# Patient Record
Sex: Male | Born: 1950 | Race: Black or African American | Hispanic: No | Marital: Single | Smoking: Never smoker
Health system: Southern US, Community
[De-identification: ages and names within clinical notes are randomized; demographics above are authoritative.]

## PROBLEM LIST (undated history)

## (undated) DIAGNOSIS — D179 Benign lipomatous neoplasm, unspecified: Secondary | ICD-10-CM

---

## 2006-03-16 ENCOUNTER — Ambulatory Visit (HOSPITAL_COMMUNITY): Admission: RE | Admit: 2006-03-16 | Discharge: 2006-03-16 | Payer: Self-pay | Admitting: Gastroenterology

## 2006-03-16 ENCOUNTER — Encounter (INDEPENDENT_AMBULATORY_CARE_PROVIDER_SITE_OTHER): Payer: Self-pay | Admitting: *Deleted

## 2009-04-16 ENCOUNTER — Encounter: Admission: RE | Admit: 2009-04-16 | Discharge: 2009-04-16 | Payer: Self-pay | Admitting: Occupational Medicine

## 2010-03-05 IMAGING — CR DG NASAL BONES 3+V
2 series · 2 of 2 positions shown · non-contrast
Comparison: None

CLINICAL DATA: Injured playing basketball

NASAL BONES - 3+ VIEW

[view not recorded (1 of 2)]
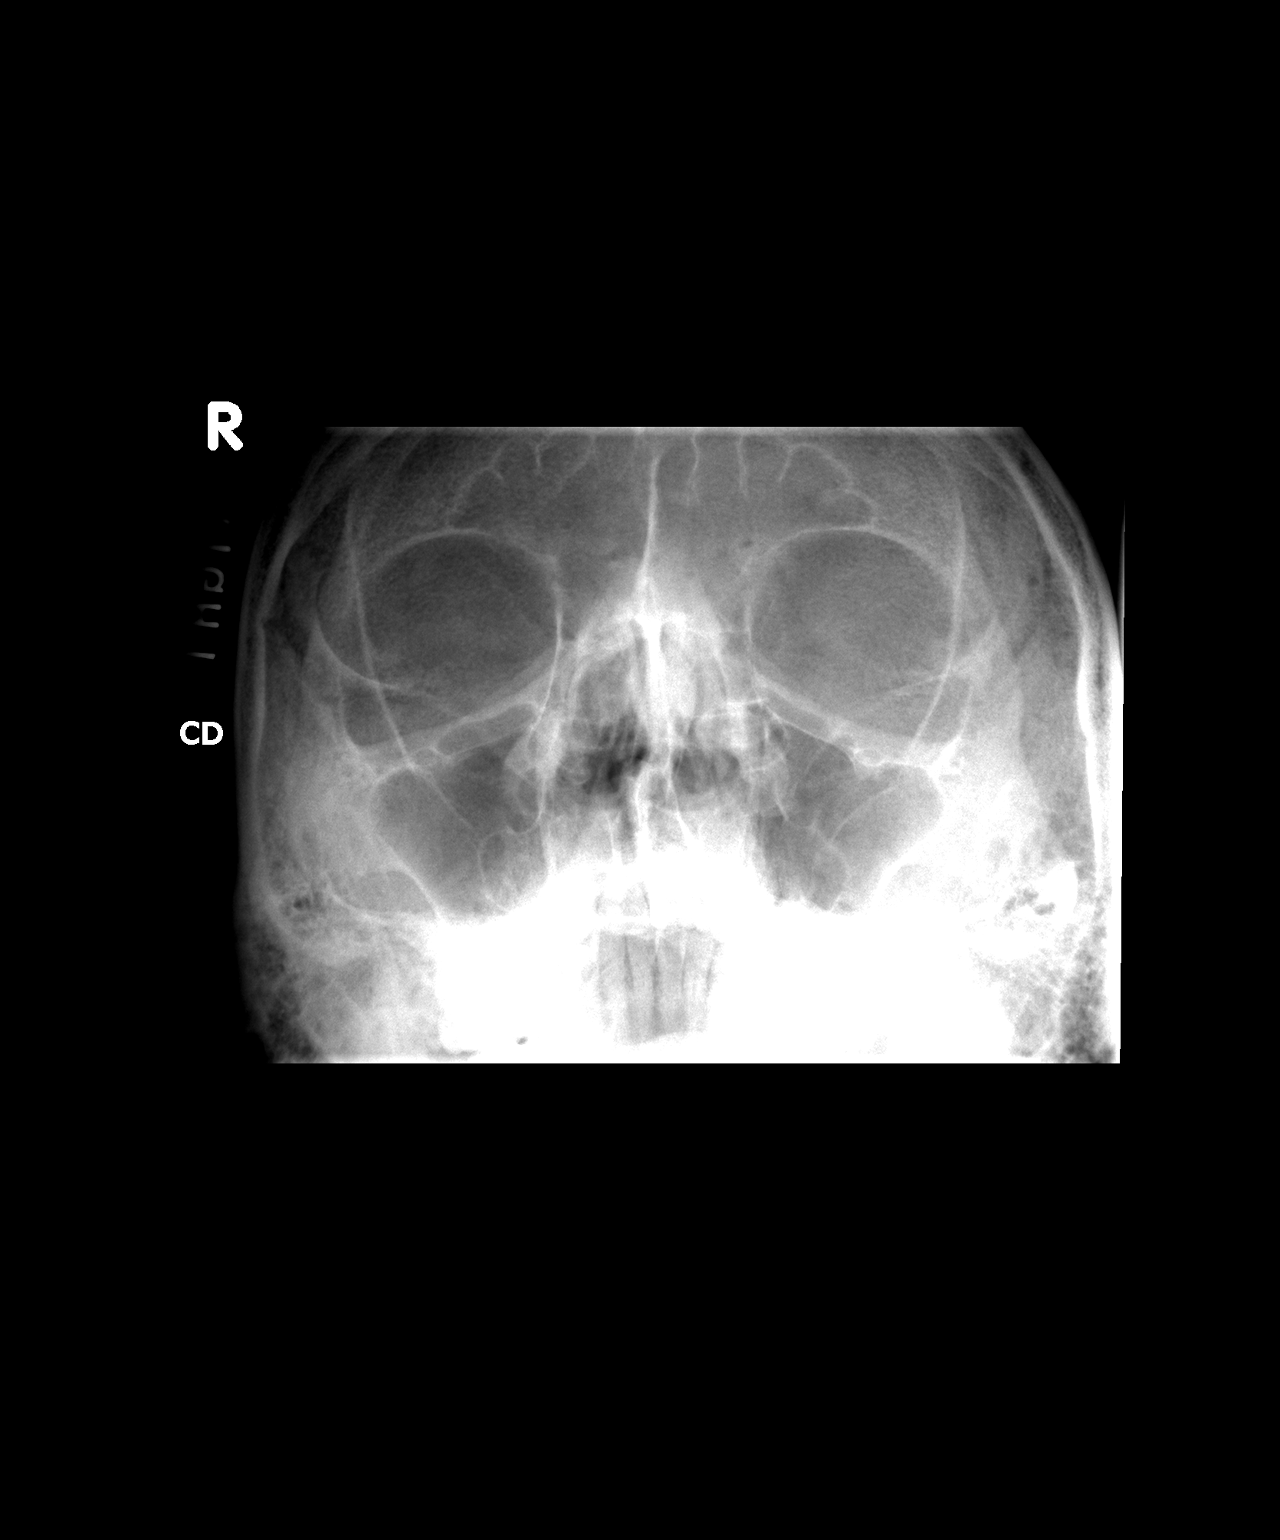

[view not recorded (2 of 2)]
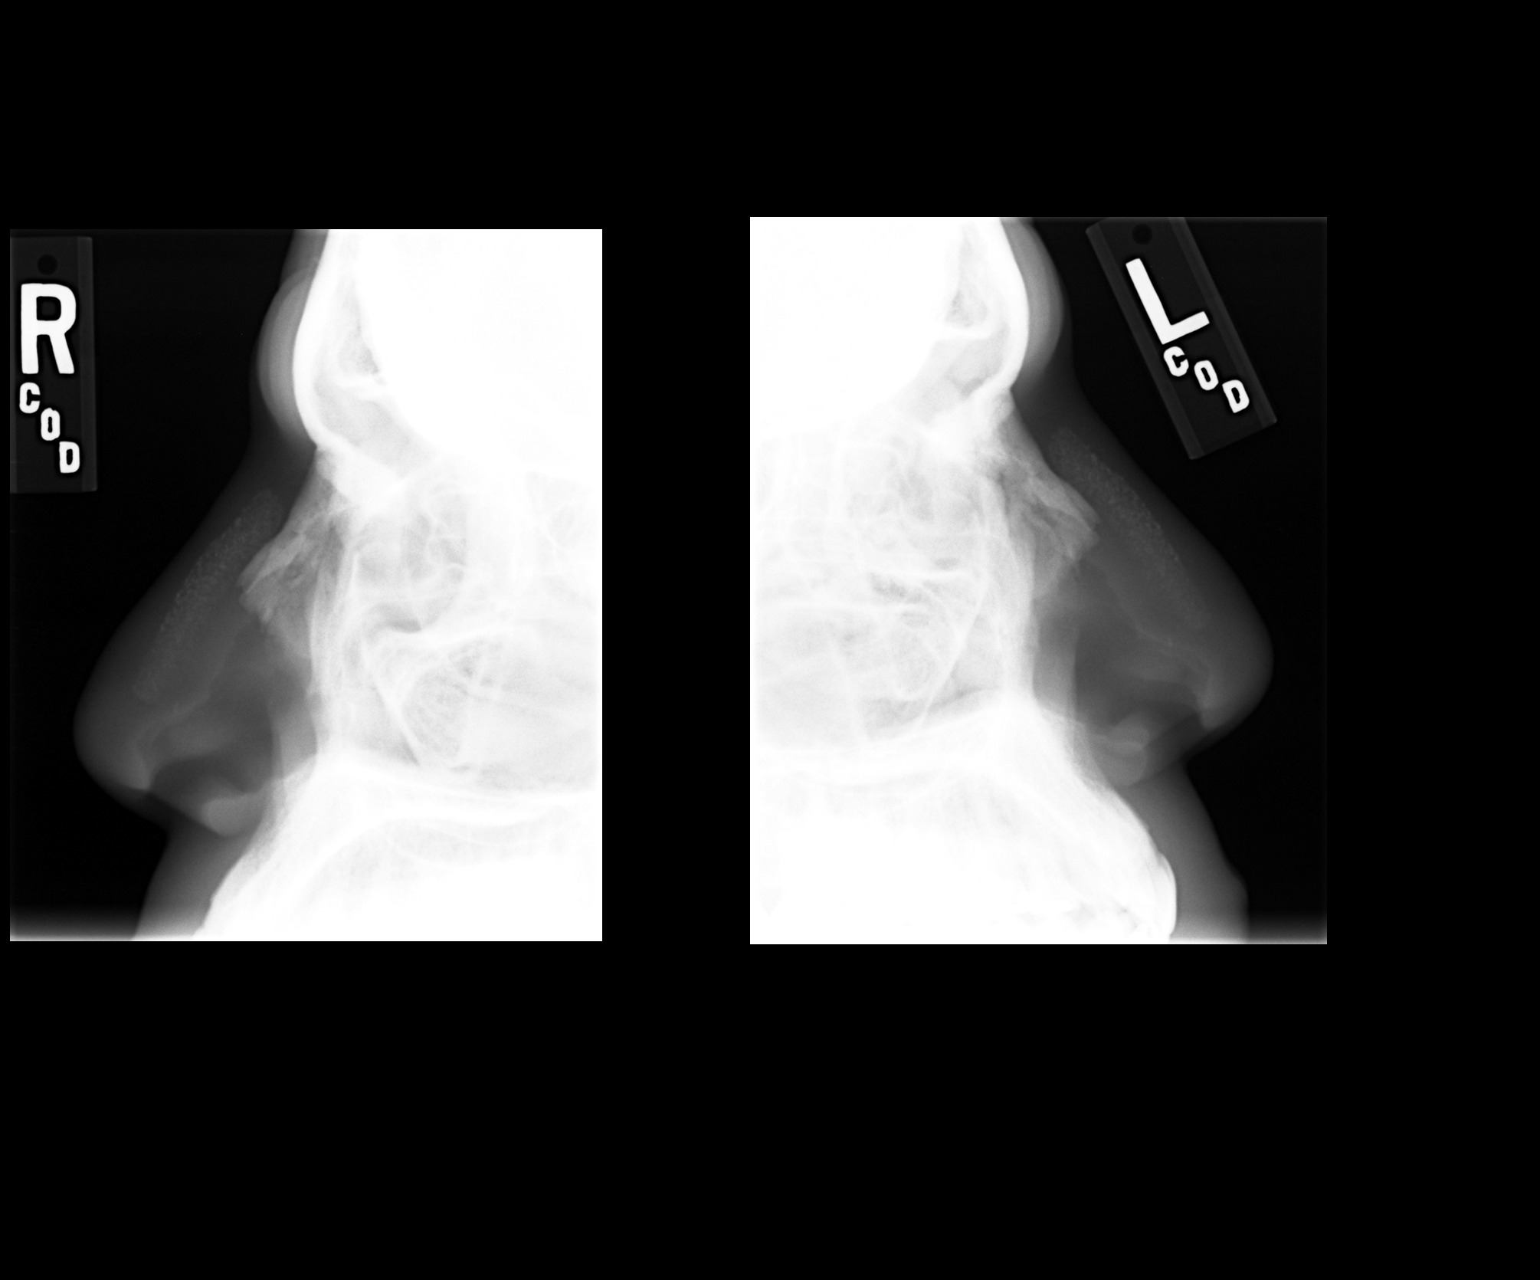

[2 of 2 positions shown; findings below may reference images not displayed]

FINDINGS: No acute fracture is seen.  There is however a linear
soft tissue opacity noted just superficial to the nasal bone
extending distally.  I believe this probably relates to the prior
nasal surgery for prior fracture.  The nasal spine is intact and
the paranasal sinuses are clear.
IMPRESSION: No acute nasal bone fracture.

## 2011-10-10 HISTORY — PX: NASAL FRACTURE SURGERY: SHX718

## 2014-02-18 ENCOUNTER — Encounter (HOSPITAL_BASED_OUTPATIENT_CLINIC_OR_DEPARTMENT_OTHER): Payer: Self-pay | Admitting: *Deleted

## 2014-02-20 ENCOUNTER — Encounter (HOSPITAL_BASED_OUTPATIENT_CLINIC_OR_DEPARTMENT_OTHER): Payer: Self-pay | Admitting: *Deleted

## 2014-02-20 NOTE — Progress Notes (Signed)
NPO AFTER MN WITH EXCEPTION CLEAR LIQUIDS UNTIL 0830 (NO CREAM/ MILK PRODUCTS).  ARRIVE AT 1300. NEEDS HG. 

## 2014-02-23 ENCOUNTER — Ambulatory Visit (HOSPITAL_BASED_OUTPATIENT_CLINIC_OR_DEPARTMENT_OTHER)
Admission: RE | Admit: 2014-02-23 | Discharge: 2014-02-23 | Disposition: A | Discharge status: Home / Self Care | Payer: 59 | Source: Ambulatory Visit | Attending: Plastic Surgery | Admitting: Plastic Surgery

## 2014-02-23 ENCOUNTER — Ambulatory Visit (HOSPITAL_BASED_OUTPATIENT_CLINIC_OR_DEPARTMENT_OTHER): Payer: 59 | Admitting: Anesthesiology

## 2014-02-23 ENCOUNTER — Encounter (HOSPITAL_BASED_OUTPATIENT_CLINIC_OR_DEPARTMENT_OTHER)
Admission: RE | Disposition: A | Discharge status: Home / Self Care | Payer: Self-pay | Source: Ambulatory Visit | Attending: Plastic Surgery

## 2014-02-23 ENCOUNTER — Encounter (HOSPITAL_BASED_OUTPATIENT_CLINIC_OR_DEPARTMENT_OTHER): Payer: 59 | Admitting: Anesthesiology

## 2014-02-23 ENCOUNTER — Encounter (HOSPITAL_BASED_OUTPATIENT_CLINIC_OR_DEPARTMENT_OTHER): Payer: Self-pay | Admitting: Plastic Surgery

## 2014-02-23 ENCOUNTER — Other Ambulatory Visit: Payer: Self-pay | Admitting: Plastic Surgery

## 2014-02-23 DIAGNOSIS — D171 Benign lipomatous neoplasm of skin and subcutaneous tissue of trunk: Secondary | ICD-10-CM

## 2014-02-23 DIAGNOSIS — D1779 Benign lipomatous neoplasm of other sites: Secondary | ICD-10-CM | POA: Insufficient documentation

## 2014-02-23 HISTORY — DX: Benign lipomatous neoplasm, unspecified: D17.9

## 2014-02-23 HISTORY — PX: LIPOMA EXCISION: SHX5283

## 2014-02-23 LAB — POCT HEMOGLOBIN-HEMACUE: Hemoglobin: 14.7 g/dL (ref 13.0–17.0)

## 2014-02-23 SURGERY — EXCISION LIPOMA
Anesthesia: General | Site: Back

## 2014-02-23 MED ORDER — PROMETHAZINE HCL 25 MG/ML IJ SOLN
6.2500 mg | INTRAMUSCULAR | Status: DC | PRN
  Filled 2014-02-23: qty 1

## 2014-02-23 MED ORDER — FENTANYL CITRATE 0.05 MG/ML IJ SOLN
25.0000 ug | INTRAMUSCULAR | Status: DC | PRN
Start: 2014-02-23 — End: 2014-02-23
  Filled 2014-02-23: qty 1

## 2014-02-23 MED ORDER — BUPIVACAINE-EPINEPHRINE 0.25% -1:200000 IJ SOLN
INTRAMUSCULAR | Status: DC | PRN
  Administered 2014-02-23: 22 mL

## 2014-02-23 MED ORDER — EPHEDRINE SULFATE 50 MG/ML IJ SOLN
INTRAMUSCULAR | Status: DC | PRN
  Administered 2014-02-23 (×2): 10 mg via INTRAVENOUS

## 2014-02-23 MED ORDER — LIDOCAINE HCL (CARDIAC) 20 MG/ML IV SOLN
INTRAVENOUS | Status: DC | PRN
  Administered 2014-02-23: 60 mg via INTRAVENOUS

## 2014-02-23 MED ORDER — PROPOFOL 10 MG/ML IV BOLUS
INTRAVENOUS | Status: DC | PRN
Start: 2014-02-23 — End: 2014-02-23
  Administered 2014-02-23: 200 mg via INTRAVENOUS

## 2014-02-23 MED ORDER — KETOROLAC TROMETHAMINE 30 MG/ML IJ SOLN
INTRAMUSCULAR | Status: DC | PRN
  Administered 2014-02-23: 30 mg via INTRAVENOUS

## 2014-02-23 MED ORDER — SUCCINYLCHOLINE CHLORIDE 20 MG/ML IJ SOLN
INTRAMUSCULAR | Status: DC | PRN
  Administered 2014-02-23: 100 mg via INTRAVENOUS

## 2014-02-23 MED ORDER — LACTATED RINGERS IV SOLN
INTRAVENOUS | Status: DC | PRN
  Administered 2014-02-23 (×2): via INTRAVENOUS

## 2014-02-23 MED ORDER — FENTANYL CITRATE 0.05 MG/ML IJ SOLN
INTRAMUSCULAR | Status: DC | PRN
  Administered 2014-02-23 (×3): 12.5 ug via INTRAVENOUS
  Administered 2014-02-23: 50 ug via INTRAVENOUS
  Administered 2014-02-23: 12.5 ug via INTRAVENOUS

## 2014-02-23 MED ORDER — DEXAMETHASONE SODIUM PHOSPHATE 4 MG/ML IJ SOLN
INTRAMUSCULAR | Status: DC | PRN
  Administered 2014-02-23: 10 mg via INTRAVENOUS

## 2014-02-23 MED ORDER — LACTATED RINGERS IV SOLN
INTRAVENOUS | Status: DC
  Administered 2014-02-23: 14:00:00 via INTRAVENOUS
  Filled 2014-02-23: qty 1000

## 2014-02-23 MED ORDER — FENTANYL CITRATE 0.05 MG/ML IJ SOLN
INTRAMUSCULAR | Status: AC
  Filled 2014-02-23: qty 4

## 2014-02-23 MED ORDER — ONDANSETRON HCL 4 MG/2ML IJ SOLN
INTRAMUSCULAR | Status: DC | PRN
  Administered 2014-02-23: 4 mg via INTRAVENOUS

## 2014-02-23 MED ORDER — CEFAZOLIN SODIUM-DEXTROSE 2-3 GM-% IV SOLR
2.0000 g | INTRAVENOUS | Status: DC
  Filled 2014-02-23: qty 50

## 2014-02-23 MED ORDER — MIDAZOLAM HCL 5 MG/5ML IJ SOLN
INTRAMUSCULAR | Status: DC | PRN
  Administered 2014-02-23 (×2): 1 mg via INTRAVENOUS

## 2014-02-23 MED ORDER — CEFAZOLIN SODIUM-DEXTROSE 2-3 GM-% IV SOLR
INTRAVENOUS | Status: DC | PRN
  Administered 2014-02-23: 2 g via INTRAVENOUS

## 2014-02-23 MED ORDER — MIDAZOLAM HCL 2 MG/2ML IJ SOLN
INTRAMUSCULAR | Status: AC
  Filled 2014-02-23: qty 2

## 2014-02-23 SURGICAL SUPPLY — 64 items
ADH SKN CLS APL DERMABOND .7 (GAUZE/BANDAGES/DRESSINGS) ×1
BLADE SURG 15 STRL LF DISP TIS (BLADE) ×1 IMPLANT
BLADE SURG 15 STRL SS (BLADE) ×3
BLADE SURG ROTATE 9660 (MISCELLANEOUS) IMPLANT
BNDG CMPR MD 5X2 ELC HKLP STRL (GAUZE/BANDAGES/DRESSINGS)
BNDG CONFORM 2 STRL LF (GAUZE/BANDAGES/DRESSINGS) IMPLANT
BNDG ELASTIC 2 VLCR STRL LF (GAUZE/BANDAGES/DRESSINGS) IMPLANT
CANISTER SUCTION 1200CC (MISCELLANEOUS) ×2 IMPLANT
CHLORAPREP W/TINT 26ML (MISCELLANEOUS) ×2 IMPLANT
CLOSURE WOUND 1/2 X4 (GAUZE/BANDAGES/DRESSINGS)
CORDS BIPOLAR (ELECTRODE) IMPLANT
COVER MAYO STAND STRL (DRAPES) ×3 IMPLANT
COVER TABLE BACK 60X90 (DRAPES) ×3 IMPLANT
DECANTER SPIKE VIAL GLASS SM (MISCELLANEOUS) ×1 IMPLANT
DERMABOND ADVANCED (GAUZE/BANDAGES/DRESSINGS) ×2
DERMABOND ADVANCED .7 DNX12 (GAUZE/BANDAGES/DRESSINGS) IMPLANT
DRAPE LG THREE QUARTER DISP (DRAPES) IMPLANT
DRAPE PED LAPAROTOMY (DRAPES) ×2 IMPLANT
DRAPE U 20/CS (DRAPES) IMPLANT
DRSG TEGADERM 2-3/8X2-3/4 SM (GAUZE/BANDAGES/DRESSINGS) IMPLANT
ELECT BLADE 6.5 .24CM SHAFT (ELECTRODE) ×2 IMPLANT
ELECT NDL BLADE 2-5/6 (NEEDLE) ×1 IMPLANT
ELECT NEEDLE BLADE 2-5/6 (NEEDLE) ×3 IMPLANT
ELECT REM PT RETURN 9FT ADLT (ELECTROSURGICAL) ×3
ELECTRODE REM PT RTRN 9FT ADLT (ELECTROSURGICAL) ×1 IMPLANT
GAUZE SPONGE 4X4 12PLY STRL LF (GAUZE/BANDAGES/DRESSINGS) IMPLANT
GAUZE SPONGE 4X4 16PLY XRAY LF (GAUZE/BANDAGES/DRESSINGS) ×2 IMPLANT
GAUZE XEROFORM 1X8 LF (GAUZE/BANDAGES/DRESSINGS) IMPLANT
GLOVE BIO SURGEON STRL SZ 6.5 (GLOVE) ×5 IMPLANT
GLOVE BIO SURGEONS STRL SZ 6.5 (GLOVE) ×3
GLOVE INDICATOR 7.5 STRL GRN (GLOVE) ×2 IMPLANT
GLOVE SURG SS PI 7.5 STRL IVOR (GLOVE) ×2 IMPLANT
GOWN PREVENTION PLUS XLARGE (GOWN DISPOSABLE) ×2 IMPLANT
GOWN STRL REUS W/TWL LRG LVL3 (GOWN DISPOSABLE) ×4 IMPLANT
GOWN STRL REUS W/TWL XL LVL3 (GOWN DISPOSABLE) ×2 IMPLANT
NDL HYPO 30GX1 BEV (NEEDLE) ×1 IMPLANT
NEEDLE 27GAX1X1/2 (NEEDLE) IMPLANT
NEEDLE HYPO 30GX1 BEV (NEEDLE) ×3 IMPLANT
NS IRRIG 500ML POUR BTL (IV SOLUTION) IMPLANT
PACK BASIN DAY SURGERY FS (CUSTOM PROCEDURE TRAY) ×3 IMPLANT
PENCIL BUTTON HOLSTER BLD 10FT (ELECTRODE) ×3 IMPLANT
SPONGE GAUZE 2X2 8PLY STER LF (GAUZE/BANDAGES/DRESSINGS)
SPONGE GAUZE 2X2 8PLY STRL LF (GAUZE/BANDAGES/DRESSINGS) IMPLANT
SPONGE GAUZE 4X4 12PLY (GAUZE/BANDAGES/DRESSINGS) ×2 IMPLANT
STRIP CLOSURE SKIN 1/2X4 (GAUZE/BANDAGES/DRESSINGS) IMPLANT
SUCTION FRAZIER TIP 10 FR DISP (SUCTIONS) ×2 IMPLANT
SUT MNCRL 6-0 UNDY P1 1X18 (SUTURE) IMPLANT
SUT MNCRL AB 4-0 PS2 18 (SUTURE) ×2 IMPLANT
SUT MON AB 5-0 P3 18 (SUTURE) IMPLANT
SUT MON AB 5-0 PS2 18 (SUTURE) ×2 IMPLANT
SUT MONOCRYL 6-0 P1 1X18 (SUTURE)
SUT PROLENE 5 0 P 3 (SUTURE) IMPLANT
SUT PROLENE 5 0 PS 2 (SUTURE) IMPLANT
SUT PROLENE 6 0 P 1 18 (SUTURE) IMPLANT
SUT VIC AB 5-0 P-3 18X BRD (SUTURE) IMPLANT
SUT VIC AB 5-0 P3 18 (SUTURE)
SUT VIC AB 5-0 PS2 18 (SUTURE) IMPLANT
SUT VICRYL 4-0 PS2 18IN ABS (SUTURE) IMPLANT
SYR BULB 3OZ (MISCELLANEOUS) IMPLANT
SYR CONTROL 10ML LL (SYRINGE) ×3 IMPLANT
TOWEL OR 17X24 6PK STRL BLUE (TOWEL DISPOSABLE) ×5 IMPLANT
TRAY DSU PREP LF (CUSTOM PROCEDURE TRAY) ×3 IMPLANT
TUBE CONNECTING 12'X1/4 (SUCTIONS)
TUBE CONNECTING 12X1/4 (SUCTIONS) IMPLANT

## 2014-02-23 NOTE — Discharge Instructions (Signed)
Keep pressure on the area. May shower tomorrow.   Post Anesthesia Home Care Instructions  Activity: Get plenty of rest for the remainder of the day. A responsible adult should stay with you for 24 hours following the procedure.  For the next 24 hours, DO NOT: -Drive a car -Paediatric nurse -Drink alcoholic beverages -Take any medication unless instructed by your physician -Make any legal decisions or sign important papers.  Meals: Start with liquid foods such as gelatin or soup. Progress to regular foods as tolerated. Avoid greasy, spicy, heavy foods. If nausea and/or vomiting occur, drink only clear liquids until the nausea and/or vomiting subsides. Call your physician if vomiting continues.  Special Instructions/Symptoms: Your throat may feel dry or sore from the anesthesia or the breathing tube placed in your throat during surgery. If this causes discomfort, gargle with warm salt water. The discomfort should disappear within 24 hours.  Call your surgeon if you experience:   1.  Fever over 101.0. 2.  Inability to urinate. 3.  Nausea and/or vomiting. 4.  Extreme swelling or bruising at the surgical site. 5.  Continued bleeding from the incision. 6.  Increased pain, redness or drainage from the incision. 7.  Problems related to your pain medication.

## 2014-02-23 NOTE — Interval H&P Note (Signed)
History and Physical Interval Note:  02/23/2014 4:24 PM  Raymond Franco  has presented today for surgery, with the diagnosis of Lipoma of other specified sites  The various methods of treatment have been discussed with the patient and family. After consideration of risks, benefits and other options for treatment, the patient has consented to  Procedure(s): EXCISION LIPOMA ON BACK (N/A) as a surgical intervention .  The patient's history has been reviewed, patient examined, no change in status, stable for surgery.  I have reviewed the patient's chart and labs.  Questions were answered to the patient's satisfaction.     Lyndee Leo [employer]

## 2014-02-23 NOTE — H&P (Signed)
Raymond Franco is an 63 y.o. male.   Chief Complaint: lipoma of back HPI: The patient is a 63 yrs old male with a lipoma of the back.  He has noticed the area for over a year and it has been getting larger.  Nothing seems to make it better. It is not painful but very uncomfortable to lay on the area.  He is otherwise in good health.  Past Medical History  Diagnosis Date  . Lipoma     Past Surgical History  Procedure Laterality Date  . Nasal fracture surgery  2013    No family history on file. Social History:  reports that he has never smoked. He has never used smokeless tobacco. He reports that he drinks alcohol. He reports that he does not use illicit drugs.  Allergies: No Known Allergies   (Not in a hospital admission)  No results found for this or any previous visit (from the past 48 hour(s)). No results found.  Review of Systems  Constitutional: Negative.   HENT: Negative.   Eyes: Negative.   Respiratory: Negative.   Cardiovascular: Negative.   Gastrointestinal: Negative.   Genitourinary: Negative.   Musculoskeletal: Negative.   Skin: Negative.   Neurological: Negative.   Psychiatric/Behavioral: Negative.  Negative for depression.    There were no vitals taken for this visit. Physical Exam  Constitutional: He is oriented to person, place, and time. He appears well-developed and well-nourished.  HENT:  Head: Normocephalic and atraumatic.  Eyes: Conjunctivae are normal. Pupils are equal, round, and reactive to light.  Neck: Normal range of motion.  Cardiovascular: Normal rate.   Respiratory: Effort normal.  GI: Soft.  Musculoskeletal: Normal range of motion.  Neurological: He is alert and oriented to person, place, and time.  Skin: Skin is warm.  Psychiatric: He has a normal mood and affect. His behavior is normal. Judgment and thought content normal.     Assessment/Plan Plan to excise the lipoma of his back.  Emilie Carp Sanger 02/23/2014, 8:28 AM    

## 2014-02-23 NOTE — Anesthesia Preprocedure Evaluation (Signed)

## 2014-02-23 NOTE — Transfer of Care (Signed)
Immediate Anesthesia Transfer of Care Note  Patient: Raymond Franco  Procedure(s) Performed: Procedure(s) (LRB): EXCISION LIPOMA ON BACK (N/A)  Patient Location: PACU  Anesthesia Type: General  Level of Consciousness: awake, sedated, patient cooperative and responds to stimulation  Airway & Oxygen Therapy: Patient Spontanous Breathing and Patient connected to face mask oxygen  Post-op Assessment: Report given to PACU RN, Post -op Vital signs reviewed and stable and Patient moving all extremities  Post vital signs: Reviewed and stable  Complications: No apparent anesthesia complications

## 2014-02-23 NOTE — H&P (View-Only) (Signed)
Raymond Franco is an 63 y.o. male.   Chief Complaint: lipoma of back HPI: The patient is a 63 yrs old male with a lipoma of the back.  He has noticed the area for over a year and it has been getting larger.  Nothing seems to make it better. It is not painful but very uncomfortable to lay on the area.  He is otherwise in good health.  Past Medical History  Diagnosis Date  . Lipoma     Past Surgical History  Procedure Laterality Date  . Nasal fracture surgery  2013    No family history on file. Social History:  reports that he has never smoked. He has never used smokeless tobacco. He reports that he drinks alcohol. He reports that he does not use illicit drugs.  Allergies: No Known Allergies   (Not in a hospital admission)  No results found for this or any previous visit (from the past 48 hour(s)). No results found.  Review of Systems  Constitutional: Negative.   HENT: Negative.   Eyes: Negative.   Respiratory: Negative.   Cardiovascular: Negative.   Gastrointestinal: Negative.   Genitourinary: Negative.   Musculoskeletal: Negative.   Skin: Negative.   Neurological: Negative.   Psychiatric/Behavioral: Negative.  Negative for depression.    There were no vitals taken for this visit. Physical Exam  Constitutional: He is oriented to person, place, and time. He appears well-developed and well-nourished.  HENT:  Head: Normocephalic and atraumatic.  Eyes: Conjunctivae are normal. Pupils are equal, round, and reactive to light.  Neck: Normal range of motion.  Cardiovascular: Normal rate.   Respiratory: Effort normal.  GI: Soft.  Musculoskeletal: Normal range of motion.  Neurological: He is alert and oriented to person, place, and time.  Skin: Skin is warm.  Psychiatric: He has a normal mood and affect. His behavior is normal. Judgment and thought content normal.     Assessment/Plan Plan to excise the lipoma of his back.  Claire Sanger 02/23/2014, 8:28 AM

## 2014-02-23 NOTE — Anesthesia Procedure Notes (Signed)
Procedure Name: Intubation Date/Time: 02/23/2014 3:20 PM Performed by: Justice Rocher Pre-anesthesia Checklist: Patient identified, Emergency Drugs available, Suction available and Patient being monitored Patient Re-evaluated:Patient Re-evaluated prior to inductionOxygen Delivery Method: Circle System Utilized Preoxygenation: Pre-oxygenation with 100% oxygen Intubation Type: IV induction Ventilation: Mask ventilation without difficulty Tube type: Oral Tube size: 8.0 mm Number of attempts: 1 Airway Equipment and Method: stylet and oral airway Placement Confirmation: ETT inserted through vocal cords under direct vision,  positive ETCO2 and breath sounds checked- equal and bilateral Secured at: 22 cm Tube secured with: Tape Dental Injury: Teeth and Oropharynx as per pre-operative assessment

## 2014-02-23 NOTE — Op Note (Addendum)
Operative Note   DATE OF OPERATION: 02/23/2014  LOCATION: [place]  SURGICAL DIVISION: Plastic Surgery  PREOPERATIVE DIAGNOSES:  Back Lipoma  POSTOPERATIVE DIAGNOSES:  same  PROCEDURE:  Excision of back lipoma (5 x 5 cm)  SURGEON: Theodoro Kos, DO  ASSISTANT: Shawn Rayburn, PA  ANESTHESIA:  General.   COMPLICATIONS: None.   INDICATIONS FOR PROCEDURE:  The patient, Raymond Franco, is a 63 y.o. male born on November 29, 1950, is here for treatment of a back lipoma.   CONSENT:  Informed consent was obtained directly from the patient. Risks, benefits and alternatives were fully discussed. Specific risks including but not limited to bleeding, infection, hematoma, seroma, scarring, pain, implant infection, implant extrusion, capsular contracture, asymmetry, wound healing problems, and need for further surgery were all discussed. The patient did have an ample opportunity to have questions answered to satisfaction.   DESCRIPTION OF PROCEDURE:  The patient was taken to the operating room. SCDs were placed and IV antibiotics were given. The patient's operative site was prepped and draped in a sterile fashion. A time out was performed and all information was confirmed to be correct.  General anesthesia was administered.  A horizontal incision was made of the area in the # direction.  The bovie and scissors were used to dissect around the mass and release it from the surrounding soft tissue.  Hemostasis was achieved with electrocautery.  The deep layer was closed with 4-0 Monocryl.  The skin was closed with a running 5-0 Monocryl and vertical mattress sutures were placed with 4-0 Monocryl.  The patient tolerated the procedure well.  There were no complications. The patient was allowed to wake from anesthesia, extubated and taken to the recovery room in satisfactory condition.

## 2014-02-23 NOTE — Brief Op Note (Signed)
02/23/2014  3:24 PM  PATIENT:  Raymond Franco  63 y.o. male  PRE-OPERATIVE DIAGNOSIS:  Lipoma of other specified sites  POST-OPERATIVE DIAGNOSIS:  Lipoma of other specified sites  PROCEDURE:  Procedure(s): EXCISION LIPOMA ON BACK (N/A)  SURGEON:  Surgeon(s) and Role:    * Claire Sanger, DO - Primary  PHYSICIAN ASSISTANT: Shawn Rayburn, PA  ASSISTANTS: none   ANESTHESIA:   general  EBL:     BLOOD ADMINISTERED:none  DRAINS: none   LOCAL MEDICATIONS USED:  MARCAINE     SPECIMEN:  Source of Specimen:  lipoma  DISPOSITION OF SPECIMEN:  PATHOLOGY  COUNTS:  YES  TOURNIQUET:  * No tourniquets in log *  DICTATION: .Dragon Dictation  PLAN OF CARE: Discharge to home after PACU  PATIENT DISPOSITION:  PACU - hemodynamically stable.   Delay start of Pharmacological VTE agent (>24hrs) due to surgical blood loss or risk of bleeding: no

## 2014-02-24 ENCOUNTER — Encounter (HOSPITAL_BASED_OUTPATIENT_CLINIC_OR_DEPARTMENT_OTHER): Payer: Self-pay | Admitting: Plastic Surgery

## 2014-02-24 NOTE — Anesthesia Postprocedure Evaluation (Signed)
  Anesthesia Post-op Note  Patient: Raymond Franco  Procedure(s) Performed: Procedure(s) (LRB): EXCISION LIPOMA ON BACK (N/A)  Patient Location: PACU  Anesthesia Type: General  Level of Consciousness: awake and alert   Airway and Oxygen Therapy: Patient Spontanous Breathing  Post-op Pain: mild  Post-op Assessment: Post-op Vital signs reviewed, Patient's Cardiovascular Status Stable, Respiratory Function Stable, Patent Airway and No signs of Nausea or vomiting  Last Vitals:  Filed Vitals:   02/23/14 1800  BP: 137/81  Pulse: 54  Temp: 36.1 C  Resp: 16    Post-op Vital Signs: stable   Complications: No apparent anesthesia complications

## 2015-09-30 ENCOUNTER — Encounter: Payer: Self-pay | Admitting: Family Medicine

## 2015-09-30 ENCOUNTER — Ambulatory Visit: Payer: 59 | Attending: Family Medicine | Admitting: Family Medicine

## 2015-09-30 VITALS — BP 102/64 | HR 78 | Temp 97.9°F | Resp 16 | Ht 73.0 in | Wt 209.0 lb

## 2015-09-30 DIAGNOSIS — M25551 Pain in right hip: Secondary | ICD-10-CM | POA: Diagnosis not present

## 2015-09-30 DIAGNOSIS — Z114 Encounter for screening for human immunodeficiency virus [HIV]: Secondary | ICD-10-CM | POA: Diagnosis not present

## 2015-09-30 DIAGNOSIS — Z1159 Encounter for screening for other viral diseases: Secondary | ICD-10-CM | POA: Insufficient documentation

## 2015-09-30 DIAGNOSIS — B351 Tinea unguium: Secondary | ICD-10-CM | POA: Insufficient documentation

## 2015-09-30 DIAGNOSIS — Z0000 Encounter for general adult medical examination without abnormal findings: Secondary | ICD-10-CM | POA: Diagnosis present

## 2015-09-30 DIAGNOSIS — D171 Benign lipomatous neoplasm of skin and subcutaneous tissue of trunk: Secondary | ICD-10-CM | POA: Diagnosis not present

## 2015-09-30 DIAGNOSIS — E785 Hyperlipidemia, unspecified: Secondary | ICD-10-CM | POA: Diagnosis not present

## 2015-09-30 LAB — CBC
HCT: 42.8 % (ref 39.0–52.0)
Hemoglobin: 14.3 g/dL (ref 13.0–17.0)
MCH: 29.4 pg (ref 26.0–34.0)
MCHC: 33.4 g/dL (ref 30.0–36.0)
MCV: 87.9 fL (ref 78.0–100.0)
MPV: 10.7 fL (ref 8.6–12.4)
Platelets: 234 10*3/uL (ref 150–400)
RBC: 4.87 MIL/uL (ref 4.22–5.81)
RDW: 13.1 % (ref 11.5–15.5)
WBC: 4.8 10*3/uL (ref 4.0–10.5)

## 2015-09-30 LAB — LIPID PANEL
Cholesterol: 251 mg/dL — ABNORMAL HIGH (ref 125–200)
HDL: 55 mg/dL (ref >=40)
LDL CALC: 175 mg/dL — AB (ref <130)
Total CHOL/HDL Ratio: 4.6 Ratio (ref <=5.0)
Triglycerides: 105 mg/dL (ref <150)
VLDL: 21 mg/dL (ref <30)

## 2015-09-30 LAB — COMPLETE METABOLIC PANEL WITH GFR
ALT: 15 U/L (ref 9–46)
AST: 20 U/L (ref 10–35)
Albumin: 4.1 g/dL (ref 3.6–5.1)
Alkaline Phosphatase: 44 U/L (ref 40–115)
BUN: 14 mg/dL (ref 7–25)
CALCIUM: 9.8 mg/dL (ref 8.6–10.3)
CHLORIDE: 105 mmol/L (ref 98–110)
CO2: 26 mmol/L (ref 20–31)
CREATININE: 1.34 mg/dL — AB (ref 0.70–1.25)
GFR, Est African American: 64 mL/min (ref >=60)
GFR, Est Non African American: 56 mL/min — ABNORMAL LOW (ref >=60)
Glucose, Bld: 76 mg/dL (ref 65–99)
Potassium: 4.5 mmol/L (ref 3.5–5.3)
Sodium: 140 mmol/L (ref 135–146)
Total Bilirubin: 1.1 mg/dL (ref 0.2–1.2)
Total Protein: 7.2 g/dL (ref 6.1–8.1)

## 2015-09-30 LAB — POCT URINALYSIS DIPSTICK
Bilirubin, UA: NEGATIVE
Blood, UA: NEGATIVE
GLUCOSE UA: NEGATIVE
Ketones, UA: NEGATIVE
Nitrite, UA: NEGATIVE
PROTEIN UA: NEGATIVE
Spec Grav, UA: 1.025
UROBILINOGEN UA: 0.2
pH, UA: 7

## 2015-09-30 LAB — HIV ANTIBODY (ROUTINE TESTING W REFLEX): HIV: NONREACTIVE

## 2015-09-30 MED ORDER — DICLOFENAC SODIUM 1 % TD GEL: 2.0000 g | Freq: Four times a day (QID) | TRANSDERMAL | Status: DC

## 2015-09-30 MED ORDER — TERBINAFINE HCL 250 MG PO TABS: 250.0000 mg | ORAL_TABLET | Freq: Every day | ORAL | Status: DC

## 2015-09-30 NOTE — Patient Instructions (Addendum)
Raymond Franco was seen today for establish care, leg pain and annual exam.  Diagnoses and all orders for this visit:  Healthcare maintenance -     POCT urinalysis dipstick -     Lipid Panel  Onychomycosis of toenail -     COMPLETE METABOLIC PANEL WITH GFR -     terbinafine (LAMISIL) 250 MG tablet; Take 1 tablet (250 mg total) by mouth daily. For 12 weeks  Screening for HIV (human immunodeficiency virus) -     HIV antibody (with reflex)  Need for hepatitis C screening test -     Hepatitis C antibody, reflex  Lateral pain of right hip -     CBC -     diclofenac sodium (VOLTAREN) 1 % GEL; Apply 2 g topically 4 (four) times daily.   Normal physical exam today. Continue healthy lifestyle.   I recommend continued exercise and stretching for R hip pain Medications:  Topical antiinflammatory, voltaren gel Oral natural antiinflammatory: tart cherry juice  Oral pain medicine: tylenol [phone] mg every hrs as needed, ibuprofen or aleve,   F/u in 3 months for recheck on toenails, sooner if needed  Dr. Adrian Blackwater

## 2015-09-30 NOTE — Progress Notes (Signed)
Establish Care  Annual Physical Stated has pain from hip down rt leg , pain worsen in the morning  No tobacco user  No suicidal thought  No pain today

## 2015-09-30 NOTE — Assessment & Plan Note (Signed)
A; R lateral hip pain. Mild arthritis suspected. No evidence of bursitis, avascular necrosis spinal stenosis on today's exam. P: Stay active Keep a healthy weight Use tart cherry juice as a natural antiinflammatory

## 2015-09-30 NOTE — Assessment & Plan Note (Signed)
lamisil x 12 weeks LFTs and CBC done today

## 2015-09-30 NOTE — Assessment & Plan Note (Signed)
Excised.

## 2015-09-30 NOTE — Progress Notes (Signed)
Patient ID: Raymond Franco, male   DOB: [birth year], 64 y.o.   MRN: [id number] SUBJECTIVE:  Raymond Franco is a 64 y.o. male presenting for his annual checkup and to establish care. He reports R hip pain that radiates down R leg. Pain is worse in AM when he first gets out of bed. Lateral hip. No injury. No treatment to date. He is active and exercises regularly.   Past Medical History  Diagnosis Date  . Lipoma    Past Surgical History  Procedure Laterality Date  . Nasal fracture surgery  2013  . Lipoma excision N/A 02/23/2014    Procedure: EXCISION LIPOMA ON BACK;  Surgeon: Theodoro Kos, DO;  Location: [facility];  Service: Plastics;  Laterality: N/A;   Social History   Social History  . Marital Status: Divorced    Spouse Name: N/A  . Number of Children: N/A  . Years of Education: N/A   Occupational History  . Not on file.   Social History Main Topics  . Smoking status: Never Smoker   . Smokeless tobacco: Never Used  . Alcohol Use: Yes     Comment: RARE  . Drug Use: No  . Sexual Activity: Not on file   Other Topics Concern  . Not on file   Social History Narrative   Current Outpatient Prescriptions  Medication Sig Dispense Refill  . Multiple Vitamin (MULTIVITAMIN) tablet Take 1 tablet by mouth daily.     No current facility-administered medications for this visit.   Allergies: Review of patient's allergies indicates no known allergies.   Review of Systems  Constitutional: Negative for fever, chills, weight loss, malaise/fatigue and diaphoresis.  Eyes: Negative for blurred vision.  Respiratory: Negative for cough and shortness of breath.   Cardiovascular: Negative for chest pain.  Gastrointestinal: Negative for heartburn, nausea, vomiting, abdominal pain, diarrhea, constipation, blood in stool and melena.  Musculoskeletal: Positive for myalgias and joint pain. Negative for back pain, falls and neck pain.  Skin: Negative for itching and rash.   Neurological: Negative for dizziness, focal weakness, weakness and headaches.  Psychiatric/Behavioral: Negative for depression and suicidal ideas. The patient is not nervous/anxious and does not have insomnia.    OBJECTIVE:  The patient appears well, alert, oriented x 3, in no distress.  BP 102/64 mmHg  Pulse 78  Temp(Src) 97.9 F (36.6 C) (Oral)  Resp 16  Ht 6\' 1"  (1.854 m)  Wt 209 lb (94.802 kg)  BMI 27.58 kg/m2  SpO2 98% ENT normal.  Neck supple. No adenopathy or thyromegaly. PERLA. Lungs are clear, good air entry, no wheezes, rhonchi or rales. S1 and S2 normal, no murmurs, regular rate and rhythm. Abdomen is soft without tenderness, guarding, mass or organomegaly. GU exam: no penile lesions or discharge, no testicular masses or tenderness, no hernias, rectum normal, no masses, prostate normal size, symmetric, no nodules or tenderness, guaiac negative brown stool.  Extremities show no edema, normal peripheral pulses. Neurological is normal without focal findings. MSK: full ROM of R hip. Negative FABER. Normal strength. Negative straight leg. SKIN: xerotic patch on L anterior thigh otherwise normal. Thickened and yellowed toe nails.   ASSESSMENT:  healthy adult male  PLAN:  continue current healthy lifestyle patterns  Poet was seen today for establish care, leg pain and annual exam.  Diagnoses and all orders for this visit:  Healthcare maintenance -     POCT urinalysis dipstick -     Lipid Panel  Onychomycosis of toenail -  COMPLETE METABOLIC PANEL WITH GFR -     terbinafine (LAMISIL) 250 MG tablet; Take 1 tablet (250 mg total) by mouth daily. For 12 weeks  Screening for HIV (human immunodeficiency virus) -     HIV antibody (with reflex)  Need for hepatitis C screening test -     Hepatitis C antibody, reflex  Lateral pain of right hip -     CBC -     diclofenac sodium (VOLTAREN) 1 % GEL; Apply 2 g topically 4 (four) times daily.  Lipoma of back

## 2015-10-01 DIAGNOSIS — E785 Hyperlipidemia, unspecified: Secondary | ICD-10-CM | POA: Insufficient documentation

## 2015-10-01 LAB — HEPATITIS C ANTIBODY: HCV Ab: NEGATIVE

## 2015-10-01 MED ORDER — ATORVASTATIN CALCIUM 20 MG PO TABS: 20.0000 mg | ORAL_TABLET | Freq: Every day | ORAL | Status: DC

## 2015-10-01 NOTE — Addendum Note (Signed)
Addended by: Boykin Nearing on: 10/01/2015 09:05 AM   Modules accepted: Orders, SmartSet

## 2015-10-01 NOTE — Assessment & Plan Note (Signed)
Total cholesterol and LDL elevated. lipitor 20 mg recommended to lower cholesterol and lower heart disease and stroke risk

## 2015-10-14 ENCOUNTER — Telehealth: Payer: Self-pay | Admitting: *Deleted

## 2015-10-14 NOTE — Telephone Encounter (Signed)
LVM to return call.

## 2015-10-14 NOTE — Telephone Encounter (Signed)
-----   Message from Boykin Nearing, MD sent at 10/01/2015  9:04 AM EST ----- Screening HIV negative  Screening hep C negative  CMP normal except slight elevation in Cr Total cholesterol and LDL elevated. lipitor 20 mg recommended to lower cholesterol and lower heart disease and stroke risk

## 2015-10-22 NOTE — Telephone Encounter (Signed)
Pt here for lab results Date of birth verified by pt Results given  Neg HIV HEP C Normal CMP Slight elevation in Cr Elevated LDL and total cholesterol  Lipitor send to pharmacy,  Pt verbalized understanding

## 2016-01-10 ENCOUNTER — Ambulatory Visit: Payer: BLUE CROSS/BLUE SHIELD | Attending: Family Medicine | Admitting: Family Medicine

## 2016-01-10 ENCOUNTER — Encounter: Payer: Self-pay | Admitting: Family Medicine

## 2016-01-10 VITALS — BP 117/72 | HR 81 | Temp 97.6°F | Resp 16 | Ht 73.5 in | Wt 209.0 lb

## 2016-01-10 DIAGNOSIS — B351 Tinea unguium: Secondary | ICD-10-CM | POA: Diagnosis not present

## 2016-01-10 DIAGNOSIS — E785 Hyperlipidemia, unspecified: Secondary | ICD-10-CM | POA: Diagnosis not present

## 2016-01-10 DIAGNOSIS — Z79899 Other long term (current) drug therapy: Secondary | ICD-10-CM | POA: Insufficient documentation

## 2016-01-10 LAB — HEPATIC FUNCTION PANEL
ALBUMIN: 4 g/dL (ref 3.6–5.1)
ALK PHOS: 45 U/L (ref 40–115)
ALT: 16 U/L (ref 9–46)
AST: 23 U/L (ref 10–35)
BILIRUBIN INDIRECT: 0.7 mg/dL (ref 0.2–1.2)
Bilirubin, Direct: 0.2 mg/dL (ref <=0.2)
TOTAL PROTEIN: 6.8 g/dL (ref 6.1–8.1)
Total Bilirubin: 0.9 mg/dL (ref 0.2–1.2)

## 2016-01-10 NOTE — Assessment & Plan Note (Signed)
Toenail fungus treated F/u LFTs today

## 2016-01-10 NOTE — Progress Notes (Signed)
   Subjective:  Patient ID: Raymond Franco, male    DOB: August 23, 1951  Age: 65 y.o. MRN: [id number]  CC: Nail Problem   HPI Raymond Franco presents for   1. Toenail fungus: has completed lamisil. No nail pain. No GI upset.   Social History  Substance Use Topics  . Smoking status: Never Smoker   . Smokeless tobacco: Never Used  . Alcohol Use: Yes     Comment: RARE    Outpatient Prescriptions Prior to Visit  Medication Sig Dispense Refill  . atorvastatin (LIPITOR) 20 MG tablet Take 1 tablet (20 mg total) by mouth daily. 90 tablet 3  . diclofenac sodium (VOLTAREN) 1 % GEL Apply 2 g topically 4 (four) times daily. 100 g 2  . Multiple Vitamin (MULTIVITAMIN) tablet Take 1 tablet by mouth daily.    Marland Kitchen terbinafine (LAMISIL) 250 MG tablet Take 1 tablet (250 mg total) by mouth daily. For 12 weeks (Patient not taking: Reported on 01/10/2016) 30 tablet 2   No facility-administered medications prior to visit.    ROS Review of Systems  Constitutional: Negative for fever, chills, fatigue and unexpected weight change.  Eyes: Negative for visual disturbance.  Respiratory: Negative for cough and shortness of breath.   Cardiovascular: Negative for chest pain, palpitations and leg swelling.  Gastrointestinal: Negative for nausea, vomiting, abdominal pain, diarrhea, constipation and blood in stool.  Endocrine: Negative for polydipsia, polyphagia and polyuria.  Musculoskeletal: Negative for myalgias, back pain, arthralgias, gait problem and neck pain.  Skin: Negative for rash.  Allergic/Immunologic: Negative for immunocompromised state.  Hematological: Negative for adenopathy. Does not bruise/bleed easily.  Psychiatric/Behavioral: Negative for suicidal ideas, sleep disturbance and dysphoric mood. The patient is not nervous/anxious.     Objective:  BP 117/72 mmHg  Pulse 81  Temp(Src) 97.6 F (36.4 C)  Resp 16  Ht 6' 1.5" (1.867 m)  Wt 209 lb (94.802 kg)  BMI 27.20 kg/m2  SpO2 95%  BP/Weight  01/10/2016 09/30/2015 99991111  Systolic BP 123XX123 [id number]  Diastolic BP 72 64 81  Wt. (Lbs) 209 209 202  BMI 27.2 27.58 26.29   Physical Exam  Constitutional: He appears well-developed and well-nourished. No distress.  Neck: Normal range of motion. Neck supple.  Pulmonary/Chest: Effort normal.  Musculoskeletal: He exhibits no edema.  Neurological: He is alert.  Skin: Skin is warm and dry. No rash noted. No erythema.     Psychiatric: He has a normal mood and affect.    Assessment & Plan:   There are no diagnoses linked to this encounter. Khelan was seen today for nail problem.  Diagnoses and all orders for this visit:  Onychomycosis of toenail -     Hepatic Function Panel  Hyperlipidemia -     LDL Cholesterol, Direct; Future    No orders of the defined types were placed in this encounter.    Follow-up: No Follow-up on file.   Boykin Nearing MD

## 2016-01-10 NOTE — Progress Notes (Signed)
F/U toe nail fungal No pain today  No suicidal thoughts in the past two weeks

## 2016-01-10 NOTE — Patient Instructions (Addendum)
Raymond Franco was seen today for nail problem.  Diagnoses and all orders for this visit:  Onychomycosis of toenail -     Hepatic Function Panel  Hyperlipidemia -     LDL Cholesterol, Direct; Future   Excellent response to Lamisil Repeat liver function test today  Repeat direct LDL in June 2017. Lab only visit.   Inquire at the front desk for new patient packets for friends and family members.  F/u with me in 09/2017 for yearly physical, sooner if needed   Dr. Adrian Blackwater

## 2016-01-11 ENCOUNTER — Telehealth: Payer: Self-pay | Admitting: *Deleted

## 2016-01-11 NOTE — Telephone Encounter (Signed)
LVM to return call.

## 2016-01-11 NOTE — Telephone Encounter (Signed)
-----   Message from Boykin Nearing, MD sent at 01/11/2016  8:04 AM EDT ----- Normal liver function

## 2016-10-20 ENCOUNTER — Encounter: Payer: Self-pay | Admitting: Family Medicine

## 2016-10-20 ENCOUNTER — Ambulatory Visit: Payer: PPO | Attending: Family Medicine | Admitting: Family Medicine

## 2016-10-20 VITALS — BP 117/73 | HR 81 | Temp 98.0°F | Resp 16 | Wt 209.4 lb

## 2016-10-20 DIAGNOSIS — E7800 Pure hypercholesterolemia, unspecified: Secondary | ICD-10-CM | POA: Diagnosis not present

## 2016-10-20 DIAGNOSIS — Z79899 Other long term (current) drug therapy: Secondary | ICD-10-CM | POA: Diagnosis not present

## 2016-10-20 DIAGNOSIS — M21612 Bunion of left foot: Secondary | ICD-10-CM | POA: Insufficient documentation

## 2016-10-20 DIAGNOSIS — Z0000 Encounter for general adult medical examination without abnormal findings: Secondary | ICD-10-CM | POA: Diagnosis not present

## 2016-10-20 DIAGNOSIS — M21619 Bunion of unspecified foot: Secondary | ICD-10-CM | POA: Diagnosis not present

## 2016-10-20 DIAGNOSIS — M21611 Bunion of right foot: Secondary | ICD-10-CM | POA: Diagnosis not present

## 2016-10-20 DIAGNOSIS — I8390 Asymptomatic varicose veins of unspecified lower extremity: Secondary | ICD-10-CM

## 2016-10-20 DIAGNOSIS — E785 Hyperlipidemia, unspecified: Secondary | ICD-10-CM | POA: Diagnosis not present

## 2016-10-20 DIAGNOSIS — Z23 Encounter for immunization: Secondary | ICD-10-CM | POA: Insufficient documentation

## 2016-10-20 DIAGNOSIS — I8392 Asymptomatic varicose veins of left lower extremity: Secondary | ICD-10-CM | POA: Diagnosis not present

## 2016-10-20 DIAGNOSIS — Z125 Encounter for screening for malignant neoplasm of prostate: Secondary | ICD-10-CM | POA: Diagnosis not present

## 2016-10-20 LAB — COMPLETE METABOLIC PANEL WITH GFR
ALT: 14 U/L (ref 9–46)
AST: 22 U/L (ref 10–35)
Albumin: 4.1 g/dL (ref 3.6–5.1)
Alkaline Phosphatase: 42 U/L (ref 40–115)
BILIRUBIN TOTAL: 1 mg/dL (ref 0.2–1.2)
BUN: 14 mg/dL (ref 7–25)
CO2: 29 mmol/L (ref 20–31)
CREATININE: 1.35 mg/dL — AB (ref 0.70–1.25)
Calcium: 9.7 mg/dL (ref 8.6–10.3)
Chloride: 106 mmol/L (ref 98–110)
GFR, Est African American: 63 mL/min (ref >=60)
GFR, Est Non African American: 55 mL/min — ABNORMAL LOW (ref >=60)
Glucose, Bld: 82 mg/dL (ref 65–99)
Potassium: 4.6 mmol/L (ref 3.5–5.3)
Sodium: 139 mmol/L (ref 135–146)
TOTAL PROTEIN: 7.4 g/dL (ref 6.1–8.1)

## 2016-10-20 LAB — PSA: PSA: 1.1 ng/mL (ref <=4.0)

## 2016-10-20 LAB — LIPID PANEL
CHOLESTEROL: 248 mg/dL — AB (ref <200)
HDL: 55 mg/dL (ref >40)
LDL CALC: 174 mg/dL — AB (ref <100)
Total CHOL/HDL Ratio: 4.5 Ratio (ref <5.0)
Triglycerides: 94 mg/dL (ref <150)
VLDL: 19 mg/dL (ref <30)

## 2016-10-20 LAB — HEMOCCULT GUIAC POC 1CARD (OFFICE): Fecal Occult Blood, POC: NEGATIVE

## 2016-10-20 MED ORDER — MEDICAL COMPRESSION STOCKINGS MISC: 0 refills | Status: AC

## 2016-10-20 MED ORDER — ZOSTER VACCINE LIVE 19400 UNT/0.65ML ~~LOC~~ SUSR: 0.6500 mL | Freq: Once | SUBCUTANEOUS | 0 refills | Status: AC

## 2016-10-20 MED ORDER — ATORVASTATIN CALCIUM 20 MG PO TABS: 20.0000 mg | ORAL_TABLET | Freq: Every day | ORAL | 3 refills | Status: DC

## 2016-10-20 NOTE — Patient Instructions (Addendum)
[place] was seen today for annual exam.  Diagnoses and all orders for this visit:  Bunion -     Ambulatory referral to Podiatry  Varicose vein of leg -     Elastic Bandages & Supports ([place]) MISC; 30 mm Hg pressure  Healthcare maintenance -     PSA -     COMPLETE METABOLIC PANEL WITH GFR -     Hemoccult - 1 Card (office)  Hyperlipidemia, unspecified hyperlipidemia type -     Lipid Panel  Pure hypercholesterolemia -     atorvastatin (LIPITOR) 20 MG tablet; Take 1 tablet (20 mg total) by mouth daily.  Need for Zostavax administration -     Zoster Vaccine Live, PF, (ZOSTAVAX) 96295 UNT/0.65ML injection; Inject 19,400 Units into the skin once.  you will be called with appointment details for podiatry Please take Rx for compression stocking to any medical supply store  F/u in 1 year sooner if needed  Dr. Adrian Blackwater   Pneumococcal Polysaccharide Vaccine: What You Need to Know 1. Why get vaccinated? Vaccination can protect older adults (and some children and younger adults) from pneumococcal disease. Pneumococcal disease is caused by bacteria that can spread from person to person through close contact. It can cause ear infections, and it can also lead to more serious infections of the:  Lungs (pneumonia),  Blood (bacteremia), and  Covering of the brain and spinal cord (meningitis). Meningitis can cause deafness and brain damage, and it can be fatal. Anyone can get pneumococcal disease, but children under 27 years of age, people with certain medical conditions, adults over 54 years of age, and cigarette smokers are at the highest risk. About 18,000 older adults die each year from pneumococcal disease in the [place]. Treatment of pneumococcal infections with penicillin and other drugs used to be more effective. But some strains of the disease have become resistant to these drugs. This makes prevention of the disease, through vaccination, even more  important. 2. Pneumococcal polysaccharide vaccine (PPSV23) Pneumococcal polysaccharide vaccine (PPSV23) protects against 23 types of pneumococcal bacteria. It will not prevent all pneumococcal disease. PPSV23 is recommended for:  All adults 58 years of age and older,  Anyone 2 through 66 years of age with certain long-term health problems,  Anyone 2 through 66 years of age with a weakened immune system,  Adults 84 through 66 years of age who smoke cigarettes or have asthma. Most people need only one dose of PPSV. A second dose is recommended for certain high-risk groups. People 60 and older should get a dose even if they have gotten one or more doses of the vaccine before they turned 65. Your healthcare provider can give you more information about these recommendations. Most healthy adults develop protection within 2 to 3 weeks of getting the shot. 3. Some people should not get this vaccine  Anyone who has had a life-threatening allergic reaction to PPSV should not get another dose.  Anyone who has a severe allergy to any component of PPSV should not receive it. Tell your provider if you have any severe allergies.  Anyone who is moderately or severely ill when the shot is scheduled may be asked to wait until they recover before getting the vaccine. Someone with a mild illness can usually be vaccinated.  Children less than 25 years of age should not receive this vaccine.  There is no evidence that PPSV is harmful to either a pregnant woman or to her fetus. However, as a precaution, women  who need the vaccine should be vaccinated before becoming pregnant, if possible. 4. Risks of a vaccine reaction With any medicine, including vaccines, there is a chance of side effects. These are usually mild and go away on their own, but serious reactions are also possible. About half of people who get PPSV have mild side effects, such as redness or pain where the shot is given, which go away within about  two days. Less than 1 out of 100 people develop a fever, muscle aches, or more severe local reactions. Problems that could happen after any vaccine:  People sometimes faint after a medical procedure, including vaccination. Sitting or lying down for about 15 minutes can help prevent fainting, and injuries caused by a fall. Tell your doctor if you feel dizzy, or have vision changes or ringing in the ears.  Some people get severe pain in the shoulder and have difficulty moving the arm where a shot was given. This happens very rarely.  Any medication can cause a severe allergic reaction. Such reactions from a vaccine are very rare, estimated at about 1 in a million doses, and would happen within a few minutes to a few hours after the vaccination. As with any medicine, there is a very remote chance of a vaccine causing a serious injury or death. The safety of vaccines is always being monitored. For more information, visit: http://www.aguilar.org/ 5. What if there is a serious reaction? What should I look for? Look for anything that concerns you, such as signs of a severe allergic reaction, very high fever, or unusual behavior. Signs of a severe allergic reaction can include hives, swelling of the face and throat, difficulty breathing, a fast heartbeat, dizziness, and weakness. These would usually start a few minutes to a few hours after the vaccination. What should I do? If you think it is a severe allergic reaction or other emergency that can't wait, call 9-1-1 or get to the nearest hospital. Otherwise, call your doctor. Afterward, the reaction should be reported to the Vaccine Adverse Event Reporting System (VAERS). Your doctor might file this report, or you can do it yourself through the VAERS web site at www.vaers.SamedayNews.es, or by calling 2670112994. VAERS does not give medical advice. 6. How can I learn more?  Ask your doctor. He or she can give you the vaccine package insert or suggest  other sources of information.  Call your local or state health department.  Contact the Centers for Disease Control and Prevention (CDC):  Call 346-280-5995 (1-800-CDC-INFO) or  Visit CDC's website at http://hunter.com/ CDC Pneumococcal Polysaccharide Vaccine VIS (01/30/14) This information is not intended to replace advice given to you by your health care provider. Make sure you discuss any questions you have with your health care provider. Document Released: 07/23/2006 Document Revised: 06/15/2016 Document Reviewed: 06/15/2016 Elsevier Interactive Patient Education  2017 [place] (Tetanus and Diphtheria): What You Need to Know 1. Why get vaccinated? Tetanus  and diphtheria are very serious diseases. They are rare in the [place] today, but people who do become infected often have severe complications. Td vaccine is used to protect adolescents and adults from both of these diseases. Both tetanus and diphtheria are infections caused by bacteria. Diphtheria spreads from person to person through coughing or sneezing. Tetanus-causing bacteria enter the body through cuts, scratches, or wounds. TETANUS (lockjaw) causes painful muscle tightening and stiffness, usually all over the body.  It can lead to tightening of muscles in the head and neck so  you can't open your mouth, swallow, or sometimes even breathe. Tetanus kills about 1 out of every 10 people who are infected even after receiving the best medical care. DIPHTHERIA can cause a thick coating to form in the back of the throat.  It can lead to breathing problems, paralysis, heart failure, and death. Before vaccines, as many as 200,000 cases of diphtheria and hundreds of cases of tetanus were reported in the [place] each year. Since vaccination began, reports of cases for both diseases have dropped by about 99%. 2. Td vaccine Td vaccine can protect adolescents and adults from tetanus and diphtheria. Td is  usually given as a booster dose every 10 years but it can also be given earlier after a severe and dirty wound or burn. Another vaccine, called Tdap, which protects against pertussis in addition to tetanus and diphtheria, is sometimes recommended instead of Td vaccine. Your doctor or the person giving you the vaccine can give you more information. Td may safely be given at the same time as other vaccines. 3. Some people should not get this vaccine  A person who has ever had a life-threatening allergic reaction after a previous dose of any tetanus or diphtheria containing vaccine, OR has a severe allergy to any part of this vaccine, should not get Td vaccine. Tell the person giving the vaccine about any severe allergies.  Talk to your doctor if you:  had severe pain or swelling after any vaccine containing diphtheria or tetanus,  ever had a condition called Guillain Barre Syndrome (GBS),  aren't feeling well on the day the shot is scheduled. 4. What are the risks from Td vaccine? With any medicine, including vaccines, there is a chance of side effects. These are usually mild and go away on their own. Serious reactions are also possible but are rare. Most people who get Td vaccine do not have any problems with it. Mild problems following Td vaccine: (Did not interfere with activities)  Pain where the shot was given (about 8 people in 10)  Redness or swelling where the shot was given (about 1 person in 4)  Mild fever (rare)  Headache (about 1 person in 4)  Tiredness (about 1 person in 4) Moderate problems following Td vaccine: (Interfered with activities, but did not require medical attention)  Fever over 102F (rare) Severe problems following Td vaccine: (Unable to perform usual activities; required medical attention)  Swelling, severe pain, bleeding and/or redness in the arm where the shot was given (rare). Problems that could happen after any vaccine:  People sometimes faint  after a medical procedure, including vaccination. Sitting or lying down for about 15 minutes can help prevent fainting, and injuries caused by a fall. Tell your doctor if you feel dizzy, or have vision changes or ringing in the ears.  Some people get severe pain in the shoulder and have difficulty moving the arm where a shot was given. This happens very rarely.  Any medication can cause a severe allergic reaction. Such reactions from a vaccine are very rare, estimated at fewer than 1 in a million doses, and would happen within a few minutes to a few hours after the vaccination. As with any medicine, there is a very remote chance of a vaccine causing a serious injury or death. The safety of vaccines is always being monitored. For more information, visit: http://www.aguilar.org/ 5. What if there is a serious reaction? What should I look for? Look for anything that concerns you, such as signs  of a severe allergic reaction, very high fever, or unusual behavior. Signs of a severe allergic reaction can include hives, swelling of the face and throat, difficulty breathing, a fast heartbeat, dizziness, and weakness. These would usually start a few minutes to a few hours after the vaccination. What should I do?  If you think it is a severe allergic reaction or other emergency that can't wait, call 9-1-1 or get the person to the nearest hospital. Otherwise, call your doctor.  Afterward, the reaction should be reported to the Vaccine Adverse Event Reporting System (VAERS). Your doctor might file this report, or you can do it yourself through the VAERS web site at www.vaers.SamedayNews.es, or by calling 6571304405.  VAERS does not give medical advice. 6. The National Vaccine Injury Compensation Program The [employer] Vaccine Injury Compensation Program (VICP) is a federal program that was created to compensate people who may have been injured by certain vaccines. Persons who believe they may have been injured  by a vaccine can learn about the program and about filing a claim by calling 604-548-1732 or visiting the [employer] website at GoldCloset.com.ee. There is a time limit to file a claim for compensation. 7. How can I learn more?  Ask your doctor. He or she can give you the vaccine package insert or suggest other sources of information.  Call your local or state health department.  Contact the Centers for Disease Control and Prevention (CDC):  Call (816)753-9110 (1-800-CDC-INFO)  Visit CDC's website at http://hunter.com/ CDC Td Vaccine VIS (01/18/16) This information is not intended to replace advice given to you by your health care provider. Make sure you discuss any questions you have with your health care provider. Document Released: 07/23/2006 Document Revised: 06/15/2016 Document Reviewed: 06/15/2016 Elsevier Interactive Patient Education  2017 [employer].

## 2016-10-20 NOTE — Progress Notes (Signed)
Patient ID: Raymond Franco, male   DOB: 1951-06-26, 66 y.o.   MRN: [id number] SUBJECTIVE:  Raymond Franco is a 66 y.o. male presenting for his annual checkup   1. Left leg varicose veins: painless.   2. R second toe corn: tender. Present for many years. Recently painful. Also has corn on L 2nd toe that is painless.   Past Medical History:  Diagnosis Date  . Lipoma    Past Surgical History:  Procedure Laterality Date  . LIPOMA EXCISION N/A 02/23/2014   Procedure: EXCISION LIPOMA ON BACK;  Surgeon: Theodoro Kos, DO;  Location: [facility];  Service: Plastics;  Laterality: N/A;  . NASAL FRACTURE SURGERY  2013   Social History   Social History  . Marital status: Divorced    Spouse name: N/A  . Number of children: N/A  . Years of education: N/A   Occupational History  . Not on file.   Social History Main Topics  . Smoking status: Never Smoker  . Smokeless tobacco: Never Used  . Alcohol use Yes     Comment: RARE  . Drug use: No  . Sexual activity: Not on file   Other Topics Concern  . Not on file   Social History Narrative  . No narrative on file   Current Outpatient Prescriptions  Medication Sig Dispense Refill  . atorvastatin (LIPITOR) 20 MG tablet Take 1 tablet (20 mg total) by mouth daily. 90 tablet 3  . diclofenac sodium (VOLTAREN) 1 % GEL Apply 2 g topically 4 (four) times daily. 100 g 2  . Multiple Vitamin (MULTIVITAMIN) tablet Take 1 tablet by mouth daily.     No current facility-administered medications for this visit.    Allergies: Patient has no known allergies.   Review of Systems  Constitutional: Negative for chills, diaphoresis, fever, malaise/fatigue and weight loss.  Eyes: Negative for blurred vision.  Respiratory: Negative for cough and shortness of breath.   Cardiovascular: Negative for chest pain.  Gastrointestinal: Negative for abdominal pain, blood in stool, constipation, diarrhea, heartburn, melena, nausea and vomiting.   Musculoskeletal: Positive for joint pain and myalgias. Negative for back pain, falls and neck pain.  Skin: Negative for itching and rash.  Neurological: Negative for dizziness, focal weakness, weakness and headaches.  Psychiatric/Behavioral: Negative for depression and suicidal ideas. The patient is not nervous/anxious and does not have insomnia.    OBJECTIVE:  The patient appears well, alert, oriented x 3, in no distress.  BP 117/73 (BP Location: Right Arm, Patient Position: Sitting, Cuff Size: Large)   Pulse 81   Temp 98 F (36.7 C) (Oral)   Resp 16   Wt 209 lb 6.4 oz (95 kg)   SpO2 97%   BMI 27.25 kg/m  ENT normal.  Neck supple. No adenopathy or thyromegaly. PERLA. Lungs are clear, good air entry, no wheezes, rhonchi or rales. S1 and S2 normal, no murmurs, regular rate and rhythm. Abdomen is soft without tenderness, guarding, mass or organomegaly. GU exam: no penile lesions or discharge, no testicular masses or tenderness, no hernias, rectum normal, no masses, prostate normal size, symmetric, no nodules or tenderness, guaiac negative brown stool.  Extremities show no edema, normal peripheral pulses. Neurological is normal without focal findings. MSK: normal.SKIN: bunion R and L 2nd toe. Varicose veins b/l LE  ASSESSMENT:  healthy adult male  PLAN:  continue current healthy lifestyle patterns  Raymond Franco was seen today for annual exam.  Diagnoses and all orders for this visit:  Bunion -  Ambulatory referral to Podiatry  Varicose vein of leg -     Elastic Bandages & Supports ([employer]OCKINGS) MISC; 30 mm Hg pressure  Healthcare maintenance -     PSA -     COMPLETE METABOLIC PANEL WITH GFR -     Hemoccult - 1 Card (office)  Hyperlipidemia, unspecified hyperlipidemia type -     Lipid Panel  Pure hypercholesterolemia -     atorvastatin (LIPITOR) 20 MG tablet; Take 1 tablet (20 mg total) by mouth daily.  Need for Zostavax administration -     Zoster Vaccine  Live, PF, (ZOSTAVAX) 57846 UNT/0.65ML injection; Inject 19,400 Units into the skin once.  Other orders -     Tdap vaccine greater than or equal to 7yo IM -     Pneumococcal polysaccharide vaccine 23-valent greater than or equal to 2yo subcutaneous/IM

## 2016-10-24 MED ORDER — ATORVASTATIN CALCIUM 40 MG PO TABS: 40.0000 mg | ORAL_TABLET | Freq: Every day | ORAL | 3 refills | Status: DC

## 2016-10-24 NOTE — Addendum Note (Signed)
Addended by: Boykin Nearing on: 10/24/2016 01:57 PM   Modules accepted: Orders

## 2016-11-03 ENCOUNTER — Telehealth: Payer: Self-pay

## 2016-11-03 NOTE — Telephone Encounter (Signed)
Contacted pt to go over lab results pt didn't answer was unable to lvm 

## 2016-11-03 NOTE — Telephone Encounter (Signed)
Pt was called and a VM was left informing pt to return phone call for lab results. 

## 2016-11-08 ENCOUNTER — Telehealth: Payer: Self-pay | Admitting: Family Medicine

## 2016-11-08 NOTE — Telephone Encounter (Signed)
Pt was called and informed of lab results. Pt is also aware of medication change.

## 2016-11-08 NOTE — Telephone Encounter (Signed)
Patient is returning call to [facility], please follow up

## 2017-02-21 ENCOUNTER — Encounter: Payer: Self-pay | Admitting: Family Medicine

## 2017-03-29 ENCOUNTER — Encounter: Payer: Self-pay | Admitting: Family Medicine

## 2017-03-29 ENCOUNTER — Ambulatory Visit: Payer: PPO | Attending: Family Medicine | Admitting: Family Medicine

## 2017-03-29 VITALS — BP 108/68 | HR 70 | Temp 97.6°F | Wt 205.8 lb

## 2017-03-29 DIAGNOSIS — M25562 Pain in left knee: Secondary | ICD-10-CM | POA: Diagnosis not present

## 2017-03-29 DIAGNOSIS — R229 Localized swelling, mass and lump, unspecified: Secondary | ICD-10-CM | POA: Diagnosis not present

## 2017-03-29 DIAGNOSIS — M25561 Pain in right knee: Secondary | ICD-10-CM | POA: Diagnosis not present

## 2017-03-29 DIAGNOSIS — Z79899 Other long term (current) drug therapy: Secondary | ICD-10-CM | POA: Diagnosis not present

## 2017-03-29 DIAGNOSIS — R2231 Localized swelling, mass and lump, right upper limb: Secondary | ICD-10-CM | POA: Diagnosis not present

## 2017-03-29 MED ORDER — GLUCOSAMINE-CHONDROITIN 750-600 MG PO TABS: 2.0000 | ORAL_TABLET | Freq: Every day | ORAL | 0 refills | Status: DC

## 2017-03-29 MED ORDER — TURMERIC 500 MG PO CAPS: 500.0000 mg | ORAL_CAPSULE | Freq: Every day | ORAL | Status: DC

## 2017-03-29 MED ORDER — DOXYCYCLINE HYCLATE 100 MG PO TABS: 100.0000 mg | ORAL_TABLET | Freq: Two times a day (BID) | ORAL | 0 refills | Status: DC

## 2017-03-29 NOTE — Progress Notes (Signed)
Subjective:  Patient ID: Raymond Franco, male    DOB: 08/02/1951  Age: 66 y.o. MRN: [id number]  CC: Knee Pain and Mass   HPI Raymond Franco has hyperlipidemia, varicose vein of left leg he  presents for   1. Bump under R arm: noticed 1-2 weeks ago. Was tender. Treated with hydrogen peroxide for 2-3 minutes twice daily. Improving. Possibly 2 areas. Nothing under the L axilla. No rash on arm or cuts. No fever, chills or swelling in arm.   2. Bilateral knee pain: for a few weeks. He reports achy pain  in both knees. Medial pain.  Improved with rest and elevation. Some slight swelling. He has not noticed swelling. Pain is a bit worse on the R side. 6-7/10 both knees. No trauma. His current exercise a little, swimming, elliptical, a little weight lifting, some squats with body weight.   Social History  Substance Use Topics  . Smoking status: Never Smoker  . Smokeless tobacco: Never Used  . Alcohol use Yes     Comment: RARE    Outpatient Medications Prior to Visit  Medication Sig Dispense Refill  . atorvastatin (LIPITOR) 40 MG tablet Take 1 tablet (40 mg total) by mouth daily. 90 tablet 3  . diclofenac sodium (VOLTAREN) 1 % GEL Apply 2 g topically 4 (four) times daily. 100 g 2  . Elastic Bandages & Supports ([employer]) MISC 30 mm Hg pressure 2 each 0  . Multiple Vitamin (MULTIVITAMIN) tablet Take 1 tablet by mouth daily.     No facility-administered medications prior to visit.     ROS Review of Systems  Constitutional: Negative for chills, fatigue, fever and unexpected weight change.  Eyes: Negative for visual disturbance.  Respiratory: Negative for cough and shortness of breath.   Cardiovascular: Negative for chest pain, palpitations and leg swelling.  Gastrointestinal: Negative for abdominal pain, blood in stool, constipation, diarrhea, nausea and vomiting.  Endocrine: Negative for polydipsia, polyphagia and polyuria.  Musculoskeletal: Positive for arthralgias.  Negative for back pain, gait problem, myalgias and neck pain.  Skin: Negative for rash.  Allergic/Immunologic: Negative for immunocompromised state.  Hematological: Negative for adenopathy. Does not bruise/bleed easily.  Psychiatric/Behavioral: Negative for dysphoric mood, sleep disturbance and suicidal ideas. The patient is not nervous/anxious.     Objective:  BP 108/68   Pulse 70   Temp 97.6 F (36.4 C) (Oral)   Wt 205 lb 12.8 oz (93.4 kg)   SpO2 98%   BMI 26.78 kg/m   BP/Weight 03/29/2017 0/86/7619 5/0/9326  Systolic BP 712 458 099  Diastolic BP 68 73 72  Wt. (Lbs) 205.8 209.4 209  BMI 26.78 27.25 27.2    Physical Exam  Constitutional: He appears well-developed and well-nourished. No distress.  HENT:  Head: Normocephalic and atraumatic.  Neck: Normal range of motion. Neck supple.  Cardiovascular: Normal rate, regular rhythm, normal heart sounds and intact distal pulses.   Pulmonary/Chest: Effort normal and breath sounds normal.  Musculoskeletal: He exhibits no edema.       Right knee: Normal.       Left knee: Normal.  Lymphadenopathy:    He has no cervical adenopathy.    He has no axillary adenopathy.       Right: No inguinal and no supraclavicular adenopathy present.       Left: No inguinal and no supraclavicular adenopathy present.  Neurological: He is alert.  Skin: Skin is warm and dry. No rash noted. No erythema.     Psychiatric: He  has a normal mood and affect.     Assessment & Plan:  Raymond Franco was seen today for knee pain and mass.  Diagnoses and all orders for this visit:  Subcutaneous nodules -     doxycycline (VIBRA-TABS) 100 MG tablet; Take 1 tablet (100 mg total) by mouth 2 (two) times daily.  Acute pain of both knees -     Glucosamine-Chondroitin 750-600 MG TABS; Take 2 tablets by mouth daily. -     Turmeric 500 MG CAPS; Take 500 mg by mouth daily.   There are no diagnoses linked to this encounter.  No orders of the defined types were placed in  this encounter.   Follow-up: Return in about 2 weeks (around 04/12/2017) for Recheck R axilla .   Boykin Nearing MD

## 2017-03-29 NOTE — Assessment & Plan Note (Signed)
A: suspect OA P: Reassurance Glucosamine-chondroitin and turmeric Continue low impact exercises

## 2017-03-29 NOTE — Assessment & Plan Note (Signed)
Under R axilla Suspect fibroma/lipoma They do not feel like lymph nodes They are new   Plan: Course of doxycyline  Recheck in 2 weeks If no improvement US guided biopsy

## 2017-03-29 NOTE — Patient Instructions (Addendum)
Raymond Franco was seen today for knee pain and mass.  Diagnoses and all orders for this visit:  Subcutaneous nodules -     doxycycline (VIBRA-TABS) 100 MG tablet; Take 1 tablet (100 mg total) by mouth 2 (two) times daily.  Acute pain of both knees -     Glucosamine-Chondroitin 750-600 MG TABS; Take 2 tablets by mouth daily. -     Turmeric 500 MG CAPS; Take 500 mg by mouth daily.    Take doxycyline with full glass of mild or water and stay upright for 20 minutes after taking it Avoid direct sun exposure during peak hours 12-4 PM while taking medication Stop if you develop rash or skin reddening  F/u in 2 weeks for recheck of R axilla nodules  Dr. Adrian Blackwater    Osteoarthritis Osteoarthritis is a type of arthritis that affects tissue that covers the ends of bones in joints (cartilage). Cartilage acts as a cushion between the bones and helps them move smoothly. Osteoarthritis results when cartilage in the joints gets worn down. Osteoarthritis is sometimes called "wear and tear" arthritis. Osteoarthritis is the most common form of arthritis. It often occurs in older people. It is a condition that gets worse over time (a progressive condition). Joints that are most often affected by this condition are in:  Fingers.  Toes.  Hips.  Knees.  Spine, including neck and lower back.  What are the causes? This condition is caused by age-related wearing down of cartilage that covers the ends of bones. What increases the risk? The following factors may make you more likely to develop this condition:  Older age.  Being overweight or obese.  Overuse of joints, such as in athletes.  Past injury of a joint.  Past surgery on a joint.  Family history of osteoarthritis.  What are the signs or symptoms? The main symptoms of this condition are pain, swelling, and stiffness in the joint. The joint may lose its shape over time. Small pieces of bone or cartilage may break off and float inside of the  joint, which may cause more pain and damage to the joint. Small deposits of bone (osteophytes) may grow on the edges of the joint. Other symptoms may include:  A grating or scraping feeling inside the joint when you move it.  Popping or creaking sounds when you move.  Symptoms may affect one or more joints. Osteoarthritis in a major joint, such as your knee or hip, can make it painful to walk or exercise. If you have osteoarthritis in your hands, you might not be able to grip items, twist your hand, or control small movements of your hands and fingers (fine motor skills). How is this diagnosed? This condition may be diagnosed based on:  Your medical history.  A physical exam.  Your symptoms.  X-rays of the affected joint(s).  Blood tests to rule out other types of arthritis.  How is this treated? There is no cure for this condition, but treatment can help to control pain and improve joint function. Treatment plans may include:  A prescribed exercise program that allows for rest and joint relief. You may work with a physical therapist.  A weight control plan.  Pain relief techniques, such as: ? Applying heat and cold to the joint. ? Electric pulses delivered to nerve endings under the skin (transcutaneous electrical nerve stimulation, or TENS). ? Massage. ? Certain nutritional supplements.  NSAIDs or prescription medicines to help relieve pain.  Medicine to help relieve pain and inflammation (  corticosteroids). This can be given by mouth (orally) or as an injection.  Assistive devices, such as a brace, wrap, splint, specialized glove, or cane.  Surgery, such as: ? An osteotomy. This is done to reposition the bones and relieve pain or to remove loose pieces of bone and cartilage. ? Joint replacement surgery. You may need this surgery if you have very bad (advanced) osteoarthritis.  Follow these instructions at home: Activity  Rest your affected joints as directed by your  health care provider.  Do not drive or use heavy machinery while taking prescription pain medicine.  Exercise as directed. Your health care provider or physical therapist may recommend specific types of exercise, such as: ? Strengthening exercises. These are done to strengthen the muscles that support joints that are affected by arthritis. They can be performed with weights or with exercise bands to add resistance. ? Aerobic activities. These are exercises, such as brisk walking or water aerobics, that get your heart pumping. ? Range-of-motion activities. These keep your joints easy to move. ? Balance and agility exercises. Managing pain, stiffness, and swelling  If directed, apply heat to the affected area as often as told by your health care provider. Use the heat source that your health care provider recommends, such as a moist heat pack or a heating pad. ? If you have a removable assistive device, remove it as told by your health care provider. ? Place a towel between your skin and the heat source. If your health care provider tells you to keep the assistive device on while you apply heat, place a towel between the assistive device and the heat source. ? Leave the heat on for 20-30 minutes. ? Remove the heat if your skin turns bright red. This is especially important if you are unable to feel pain, heat, or cold. You may have a greater risk of getting burned.  If directed, put ice on the affected joint: ? If you have a removable assistive device, remove it as told by your health care provider. ? Put ice in a plastic bag. ? Place a towel between your skin and the bag. If your health care provider tells you to keep the assistive device on during icing, place a towel between the assistive device and the bag. ? Leave the ice on for 20 minutes, 2-3 times a day. General instructions  Take over-the-counter and prescription medicines only as told by your health care provider.  Maintain a  healthy weight. Follow instructions from your health care provider for weight control. These may include dietary restrictions.  Do not use any products that contain nicotine or tobacco, such as cigarettes and e-cigarettes. These can delay bone healing. If you need help quitting, ask your health care provider.  Use assistive devices as directed by your health care provider.  Keep all follow-up visits as told by your health care provider. This is important. Where to find more information:  [employer] of Arthritis and Musculoskeletal and Skin Diseases: www.niams.SouthExposed.es  [employer] on Aging: http://kim-miller.com/  American College of Rheumatology: www.rheumatology.org Contact a health care provider if:  Your skin turns red.  You develop a rash.  You have pain that gets worse.  You have a fever along with joint or muscle aches. Get help right away if:  You lose a lot of weight.  You suddenly lose your appetite.  You have night sweats. Summary  Osteoarthritis is a type of arthritis that affects tissue covering the ends of bones in joints (  cartilage).  This condition is caused by age-related wearing down of cartilage that covers the ends of bones.  The main symptom of this condition is pain, swelling, and stiffness in the joint.  There is no cure for this condition, but treatment can help to control pain and improve joint function. This information is not intended to replace advice given to you by your health care provider. Make sure you discuss any questions you have with your health care provider. Document Released: 09/25/2005 Document Revised: 05/29/2016 Document Reviewed: 05/29/2016 Elsevier Interactive Patient Education  [employer].

## 2017-04-13 ENCOUNTER — Ambulatory Visit: Payer: PPO | Attending: Family Medicine | Admitting: Family Medicine

## 2017-04-13 ENCOUNTER — Encounter: Payer: Self-pay | Admitting: Family Medicine

## 2017-04-13 VITALS — BP 117/80 | HR 66 | Temp 98.1°F | Ht 73.0 in | Wt 206.0 lb

## 2017-04-13 DIAGNOSIS — Z79899 Other long term (current) drug therapy: Secondary | ICD-10-CM | POA: Diagnosis not present

## 2017-04-13 DIAGNOSIS — R229 Localized swelling, mass and lump, unspecified: Secondary | ICD-10-CM

## 2017-04-13 DIAGNOSIS — I8392 Asymptomatic varicose veins of left lower extremity: Secondary | ICD-10-CM | POA: Insufficient documentation

## 2017-04-13 DIAGNOSIS — R2231 Localized swelling, mass and lump, right upper limb: Secondary | ICD-10-CM | POA: Diagnosis not present

## 2017-04-13 DIAGNOSIS — E785 Hyperlipidemia, unspecified: Secondary | ICD-10-CM | POA: Insufficient documentation

## 2017-04-13 NOTE — Patient Instructions (Addendum)
Raymond Franco was seen today for follow up right axilla nodule.  Diagnoses and all orders for this visit:  Subcutaneous nodules -     Ambulatory referral to Dermatology   The nodule in your arm pit (axilla) has resolved You do still have the two nodules on your R side and on your L thigh I have placed a dermatology referral for biopsy just confirm that these areas are benign  You will be called with dermatology the appointment details  Plan to follow up in 6 months you will be notified if sooner follow up is needed based on the dermatology evaluation   Dr. Adrian Blackwater   Starting May 28, 2017 I will be at [facility] at [address] [name], [name] [address]  Ph: [phone] Fax: [phone]  You are welcome to follow me there   Dr. Adrian Blackwater

## 2017-04-13 NOTE — Assessment & Plan Note (Signed)
Persistent subcutaneous nodules Dermatology referral for biopsy

## 2017-04-13 NOTE — Progress Notes (Signed)
Subjective:  Patient ID: Raymond Franco, male    DOB: 1950/12/08  Age: 66 y.o. MRN: [id number]  CC: Follow up Right Axilla Nodule   HPI Raymond Franco has hyperlipidemia, varicose vein of left leg he  presents for   1. Bump under R arm: noticed 4 weeks ago. Was tender. Treated with hydrogen peroxide for 2-3 minutes twice daily. Improving. Possibly 2 areas. Nothing under the L axilla. No rash on arm or cuts. No fever, chills or swelling in arm.  Took a course of doxycycline. Swelling resolved under R axilla.  Swelling persist on R flank Also reports similar but larger nodule on L hip x 1 year. Non tender.     Social History  Substance Use Topics  . Smoking status: Never Smoker  . Smokeless tobacco: Never Used  . Alcohol use Yes     Comment: RARE    Outpatient Medications Prior to Visit  Medication Sig Dispense Refill  . atorvastatin (LIPITOR) 40 MG tablet Take 1 tablet (40 mg total) by mouth daily. 90 tablet 3  . diclofenac sodium (VOLTAREN) 1 % GEL Apply 2 g topically 4 (four) times daily. 100 g 2  . doxycycline (VIBRA-TABS) 100 MG tablet Take 1 tablet (100 mg total) by mouth 2 (two) times daily. 14 tablet 0  . Elastic Bandages & Supports (MEDICAL COMPRESSION STOCKINGS) MISC 30 mm Hg pressure (Patient not taking: Reported on 03/29/2017) 2 each 0  . Glucosamine-Chondroitin 750-600 MG TABS Take 2 tablets by mouth daily.  0  . Multiple Vitamin (MULTIVITAMIN) tablet Take 1 tablet by mouth daily.    . Turmeric 500 MG CAPS Take 500 mg by mouth daily.     No facility-administered medications prior to visit.     ROS Review of Systems  Constitutional: Negative for chills, fatigue, fever and unexpected weight change.  Eyes: Negative for visual disturbance.  Respiratory: Negative for cough and shortness of breath.   Cardiovascular: Negative for chest pain, palpitations and leg swelling.  Gastrointestinal: Negative for abdominal pain, blood in stool, constipation, diarrhea, nausea and  vomiting.  Endocrine: Negative for polydipsia, polyphagia and polyuria.  Musculoskeletal: Positive for arthralgias. Negative for back pain, gait problem, myalgias and neck pain.  Skin: Negative for rash.  Allergic/Immunologic: Negative for immunocompromised state.  Hematological: Negative for adenopathy. Does not bruise/bleed easily.  Psychiatric/Behavioral: Negative for dysphoric mood, sleep disturbance and suicidal ideas. The patient is not nervous/anxious.     Objective:  BP 117/80   Pulse 66   Temp 98.1 F (36.7 C) (Oral)   Ht 6\' 1"  (1.854 m)   Wt 206 lb (93.4 kg)   SpO2 96%   BMI 27.18 kg/m   BP/Weight 04/13/2017 03/29/2017 8/33/8250  Systolic BP 539 767 341  Diastolic BP 80 68 73  Wt. (Lbs) 206 205.8 209.4  BMI 27.18 26.78 27.25  tr  Physical Exam  Constitutional: He appears well-developed and well-nourished. No distress.  HENT:  Head: Normocephalic and atraumatic.  Neck: Normal range of motion. Neck supple.  Cardiovascular: Normal rate, regular rhythm, normal heart sounds and intact distal pulses.   Pulmonary/Chest: Effort normal and breath sounds normal.  Musculoskeletal: He exhibits no edema.       Right knee: Normal.       Left knee: Normal.  Lymphadenopathy:    He has no cervical adenopathy.    He has no axillary adenopathy.       Right: No inguinal and no supraclavicular adenopathy present.  Left: No inguinal and no supraclavicular adenopathy present.  Neurological: He is alert.  Skin: Skin is warm and dry. No rash noted. No erythema.     Psychiatric: He has a normal mood and affect.     Assessment & Plan:  Raymond Franco was seen today for follow up right axilla nodule.  Diagnoses and all orders for this visit:  Subcutaneous nodules -     Ambulatory referral to Dermatology   There are no diagnoses linked to this encounter.  No orders of the defined types were placed in this encounter.   Follow-up: Return in about 6 months (around 10/14/2017) for  check in .   Boykin Nearing MD

## 2017-06-07 DIAGNOSIS — D1724 Benign lipomatous neoplasm of skin and subcutaneous tissue of left leg: Secondary | ICD-10-CM | POA: Diagnosis not present

## 2017-06-07 DIAGNOSIS — L309 Dermatitis, unspecified: Secondary | ICD-10-CM | POA: Diagnosis not present

## 2018-01-17 DIAGNOSIS — M1712 Unilateral primary osteoarthritis, left knee: Secondary | ICD-10-CM | POA: Diagnosis not present

## 2018-01-17 DIAGNOSIS — M1711 Unilateral primary osteoarthritis, right knee: Secondary | ICD-10-CM | POA: Diagnosis not present

## 2018-01-22 ENCOUNTER — Encounter: Payer: Self-pay | Admitting: Internal Medicine

## 2018-01-22 ENCOUNTER — Ambulatory Visit: Payer: PPO | Attending: Internal Medicine | Admitting: Internal Medicine

## 2018-01-22 ENCOUNTER — Other Ambulatory Visit: Payer: Self-pay

## 2018-01-22 VITALS — BP 114/72 | HR 74 | Temp 97.9°F | Resp 16 | Ht 73.0 in | Wt 203.0 lb

## 2018-01-22 DIAGNOSIS — E785 Hyperlipidemia, unspecified: Secondary | ICD-10-CM | POA: Insufficient documentation

## 2018-01-22 DIAGNOSIS — Z125 Encounter for screening for malignant neoplasm of prostate: Secondary | ICD-10-CM | POA: Diagnosis not present

## 2018-01-22 DIAGNOSIS — M7989 Other specified soft tissue disorders: Secondary | ICD-10-CM | POA: Insufficient documentation

## 2018-01-22 DIAGNOSIS — I8393 Asymptomatic varicose veins of bilateral lower extremities: Secondary | ICD-10-CM | POA: Diagnosis not present

## 2018-01-22 DIAGNOSIS — Z9889 Other specified postprocedural states: Secondary | ICD-10-CM | POA: Diagnosis not present

## 2018-01-22 DIAGNOSIS — Z2821 Immunization not carried out because of patient refusal: Secondary | ICD-10-CM

## 2018-01-22 NOTE — Patient Instructions (Signed)
Keep up the good work with healthy eating and regular exercise.

## 2018-01-22 NOTE — Progress Notes (Signed)
Establish care

## 2018-01-22 NOTE — Progress Notes (Signed)
Patient ID: Raymond Franco, male    DOB: [birth year]  MRN: [id number]  CC: Establish Care   Subjective: Raymond Franco is a 67 y.o. male who presents to establish care with me as PCP.  Last PCP was Dr. Adrian Franco His concerns today include:  Patient with history of HL, varicose veins   1.  HL:  He self discontinued Lipitor for 6 mths. -doing fairly well with eating habits.  Eating fresh fruits/veggies and limits salt.  Drinking more water and a diet tea -exercises 4-6 x a wk at the gym - eliptical and swimming -no CP/SOB.  Little swelling in LT knee at times  2. Varicose veins:  Not bothersome to him  Patient Active Problem List   Diagnosis Date Noted  . Acute pain of both knees 03/29/2017  . Subcutaneous nodules 03/29/2017  . Varicose vein of leg 10/20/2016  . Bunion 10/20/2016  . Hyperlipidemia 10/01/2015     Current Outpatient Medications on File Prior to Visit  Medication Sig Dispense Refill  . Multiple Vitamin (MULTIVITAMIN) tablet Take 1 tablet by mouth daily.    Water engineer Bandages & Supports (MEDICAL COMPRESSION STOCKINGS) MISC 30 mm Hg pressure 2 each 0   No current facility-administered medications on file prior to visit.     No Known Allergies  Social History   Socioeconomic History  . Marital status: Divorced    Spouse name: Not on file  . Number of children: Not on file  . Years of education: Not on file  . Highest education level: Not on file  Occupational History  . Not on file  Social Needs  . Financial resource strain: Not on file  . Food insecurity:    Worry: Not on file    Inability: Not on file  . Transportation needs:    Medical: Not on file    Non-medical: Not on file  Tobacco Use  . Smoking status: Never Smoker  . Smokeless tobacco: Never Used  Substance and Sexual Activity  . Alcohol use: Yes    Comment: RARE  . Drug use: No  . Sexual activity: Not on file  Lifestyle  . Physical activity:    Days per week: Not on file    Minutes  per session: Not on file  . Stress: Not on file  Relationships  . Social connections:    Talks on phone: Not on file    Gets together: Not on file    Attends religious service: Not on file    Active member of club or organization: Not on file    Attends meetings of clubs or organizations: Not on file    Relationship status: Not on file  . Intimate partner violence:    Fear of current or ex partner: Not on file    Emotionally abused: Not on file    Physically abused: Not on file    Forced sexual activity: Not on file  Other Topics Concern  . Not on file  Social History Narrative  . Not on file    No family history on file.  Past Surgical History:  Procedure Laterality Date  . LIPOMA EXCISION N/A 02/23/2014   Procedure: EXCISION LIPOMA ON BACK;  Surgeon: Theodoro Kos, DO;  Location: [facility];  Service: Plastics;  Laterality: N/A;  . NASAL FRACTURE SURGERY  2013    ROS: Review of Systems  Gen:  Sleeping ok, good appetite MSK:  Little pain LT knee post exercise GI:  BM regular.  No blood in stools, no dysphagia GU:  No problems passing urine.  No hematuria. PHYSICAL EXAM: BP 114/72 (BP Location: Left Arm, Patient Position: Sitting, Cuff Size: Large)   Pulse 74   Temp 97.9 F (36.6 C) (Oral)   Resp 16   Ht 6\' 1"  (1.854 m)   Wt 203 lb (92.1 kg)   SpO2 100%   BMI 26.78 kg/m   Wt Readings from Last 3 Encounters:  01/22/18 203 lb (92.1 kg)  04/13/17 206 lb (93.4 kg)  03/29/17 205 lb 12.8 oz (93.4 kg)    Physical Exam   General appearance - alert, well appearing, and in no distress Mental status - alert, oriented to person, place, and time, normal mood, behavior, speech, dress, motor activity, and thought processes Eyes - pupils equal and reactive, extraocular eye movements intact Nose - normal and patent, no erythema, discharge or polyps Mouth - mucous membranes moist, pharynx normal without lesions Neck - supple, no significant adenopathy Chest -  clear to auscultation, no wheezes, rales or rhonchi, symmetric air entry Heart - normal rate, regular rhythm, normal S1, S2, no murmurs, rubs, clicks or gallops Musculoskeletal -no joint deformity of the wrist hands or knees.  Good range of motion at the knees. Extremities -lower extremity edema.  Good peripheral pulses. Skin: Mild spider veins over both calf Lab Results  Component Value Date   WBC 4.8 09/30/2015   HGB 14.3 09/30/2015   HCT 42.8 09/30/2015   MCV 87.9 09/30/2015   PLT 234 09/30/2015     Chemistry      Component Value Date/Time   NA 139 10/20/2016 1146   K 4.6 10/20/2016 1146   CL 106 10/20/2016 1146   CO2 29 10/20/2016 1146   BUN 14 10/20/2016 1146   CREATININE 1.35 (H) 10/20/2016 1146      Component Value Date/Time   CALCIUM 9.7 10/20/2016 1146   ALKPHOS 42 10/20/2016 1146   AST 22 10/20/2016 1146   ALT 14 10/20/2016 1146   BILITOT 1.0 10/20/2016 1146       ASSESSMENT AND PLAN: 1. Hyperlipidemia, unspecified hyperlipidemia type We will hold off on restarting statin.  Patient would like to see what his levels look like - CBC - Comprehensive metabolic panel - Lipid panel  2. Asymptomatic varicose veins of both lower extremities -Recommend use of compression socks  3. Pneumococcal vaccination declined   4. Prostate cancer screening Discussed prostate cancer screening.  Patient willing to be screened with PSA level. - PSA  Patient was given the opportunity to ask questions.  Patient verbalized understanding of the plan and was able to repeat key elements of the plan.   Orders Placed This Encounter  Procedures  . CBC  . Comprehensive metabolic panel  . Lipid panel  . PSA     Requested Prescriptions    No prescriptions requested or ordered in this encounter    Return in about 4 months (around 05/24/2018).  Karle Plumber, MD, FACP

## 2018-01-23 LAB — COMPREHENSIVE METABOLIC PANEL
A/G RATIO: 1.4 (ref 1.2–2.2)
ALT: 19 IU/L (ref 0–44)
AST: 23 IU/L (ref 0–40)
Albumin: 4.2 g/dL (ref 3.6–4.8)
Alkaline Phosphatase: 51 IU/L (ref 39–117)
BUN/Creatinine Ratio: 9 — ABNORMAL LOW (ref 10–24)
BUN: 12 mg/dL (ref 8–27)
Bilirubin Total: 0.9 mg/dL (ref 0.0–1.2)
CALCIUM: 9.6 mg/dL (ref 8.6–10.2)
CO2: 23 mmol/L (ref 20–29)
CREATININE: 1.4 mg/dL — AB (ref 0.76–1.27)
Chloride: 100 mmol/L (ref 96–106)
GFR, EST AFRICAN AMERICAN: 60 mL/min/{1.73_m2} (ref >59)
GFR, EST NON AFRICAN AMERICAN: 52 mL/min/{1.73_m2} — AB (ref >59)
GLOBULIN, TOTAL: 3 g/dL (ref 1.5–4.5)
Glucose: 82 mg/dL (ref 65–99)
POTASSIUM: 4.5 mmol/L (ref 3.5–5.2)
Sodium: 137 mmol/L (ref 134–144)
TOTAL PROTEIN: 7.2 g/dL (ref 6.0–8.5)

## 2018-01-23 LAB — CBC
HEMATOCRIT: 41.3 % (ref 37.5–51.0)
Hemoglobin: 13.9 g/dL (ref 13.0–17.7)
MCH: 29.3 pg (ref 26.6–33.0)
MCHC: 33.7 g/dL (ref 31.5–35.7)
MCV: 87 fL (ref 79–97)
Platelets: 262 10*3/uL (ref 150–379)
RBC: 4.75 x10E6/uL (ref 4.14–5.80)
RDW: 13 % (ref 12.3–15.4)
WBC: 4.7 10*3/uL (ref 3.4–10.8)

## 2018-01-23 LAB — LIPID PANEL
CHOL/HDL RATIO: 4.4 ratio (ref 0.0–5.0)
Cholesterol, Total: 262 mg/dL — ABNORMAL HIGH (ref 100–199)
HDL: 60 mg/dL (ref >39)
LDL CALC: 188 mg/dL — AB (ref 0–99)
TRIGLYCERIDES: 72 mg/dL (ref 0–149)
VLDL Cholesterol Cal: 14 mg/dL (ref 5–40)

## 2018-01-23 LAB — PSA: PROSTATE SPECIFIC AG, SERUM: 1.2 ng/mL (ref 0.0–4.0)

## 2018-01-24 ENCOUNTER — Telehealth: Payer: Self-pay

## 2018-01-24 NOTE — Telephone Encounter (Signed)
Contacted pt to go over lab results pt didn't answer lvm asking pt to give me a call back at his earliest convenience  

## 2018-01-24 NOTE — Telephone Encounter (Signed)
Pt returned call and went over lab results. Pt is requested a copy of results and a list of things he can. Pt states he will be by Monday to pick it up

## 2018-02-01 ENCOUNTER — Telehealth: Payer: Self-pay

## 2018-02-01 NOTE — Telephone Encounter (Signed)
Pt contacted the office yesterday 01/31/2018 and lvm asking me to give him a call.   Contacted pt 02/01/2018 and pt was unavailable lvm asking pt to give me a call back at his earliest convenience

## 2018-11-18 ENCOUNTER — Ambulatory Visit: Payer: Medicare Other | Attending: Internal Medicine | Admitting: Internal Medicine

## 2018-11-18 ENCOUNTER — Encounter: Payer: Self-pay | Admitting: Internal Medicine

## 2018-11-18 VITALS — BP 129/79 | HR 88 | Temp 98.0°F | Resp 16 | Ht 73.0 in | Wt 206.2 lb

## 2018-11-18 DIAGNOSIS — Z2821 Immunization not carried out because of patient refusal: Secondary | ICD-10-CM

## 2018-11-18 DIAGNOSIS — E663 Overweight: Secondary | ICD-10-CM | POA: Diagnosis not present

## 2018-11-18 DIAGNOSIS — I8392 Asymptomatic varicose veins of left lower extremity: Secondary | ICD-10-CM | POA: Insufficient documentation

## 2018-11-18 DIAGNOSIS — I83892 Varicose veins of left lower extremities with other complications: Secondary | ICD-10-CM

## 2018-11-18 DIAGNOSIS — E782 Mixed hyperlipidemia: Secondary | ICD-10-CM

## 2018-11-18 DIAGNOSIS — Z79899 Other long term (current) drug therapy: Secondary | ICD-10-CM | POA: Insufficient documentation

## 2018-11-18 DIAGNOSIS — N182 Chronic kidney disease, stage 2 (mild): Secondary | ICD-10-CM

## 2018-11-18 DIAGNOSIS — Z6827 Body mass index (BMI) 27.0-27.9, adult: Secondary | ICD-10-CM | POA: Insufficient documentation

## 2018-11-18 NOTE — Progress Notes (Signed)
Patient ID: Raymond Franco, male    DOB: [birth year]  MRN: [id number]  CC: Follow-up (4 month )   Subjective: Raymond Franco is a 68 y.o. male who presents for chronic ds management. His concerns today include:  Patient with history of HL, varicose veins   C/o soreness behind LT knee x 4-5 mths.  Thinks it is due to varicose veins -occasionally notes swelling in the knee -wears knee brace when he works out which is up to 4 x a wk on treadmill.  Also does some stretching and lifting of wghs.  -he tries to avoid fried foods.  Drinks mainly water, occasionally he drinks juice and tea.    HL:  Went over his lipid profile from last yr.  He filled and took Lipitor for 1 mth only and did not request RF.  Does not recall having any problems with the medication.  Patient Active Problem List   Diagnosis Date Noted  . Acute pain of both knees 03/29/2017  . Subcutaneous nodules 03/29/2017  . Varicose vein of leg 10/20/2016  . Bunion 10/20/2016  . Hyperlipidemia 10/01/2015     Current Outpatient Medications on File Prior to Visit  Medication Sig Dispense Refill  . Elastic Bandages & Supports (MEDICAL COMPRESSION STOCKINGS) MISC 30 mm Hg pressure (Patient not taking: Reported on 11/18/2018) 2 each 0  . Multiple Vitamin (MULTIVITAMIN) tablet Take 1 tablet by mouth daily.     No current facility-administered medications on file prior to visit.     No Known Allergies  Social History   Socioeconomic History  . Marital status: Divorced    Spouse name: Not on file  . Number of children: Not on file  . Years of education: Not on file  . Highest education level: Not on file  Occupational History  . Not on file  Social Needs  . Financial resource strain: Not on file  . Food insecurity:    Worry: Not on file    Inability: Not on file  . Transportation needs:    Medical: Not on file    Non-medical: Not on file  Tobacco Use  . Smoking status: Never Smoker  . Smokeless tobacco: Never  Used  Substance and Sexual Activity  . Alcohol use: Yes    Comment: RARE  . Drug use: No  . Sexual activity: Not on file  Lifestyle  . Physical activity:    Days per week: Not on file    Minutes per session: Not on file  . Stress: Not on file  Relationships  . Social connections:    Talks on phone: Not on file    Gets together: Not on file    Attends religious service: Not on file    Active member of club or organization: Not on file    Attends meetings of clubs or organizations: Not on file    Relationship status: Not on file  . Intimate partner violence:    Fear of current or ex partner: Not on file    Emotionally abused: Not on file    Physically abused: Not on file    Forced sexual activity: Not on file  Other Topics Concern  . Not on file  Social History Narrative  . Not on file    No family history on file.  Past Surgical History:  Procedure Laterality Date  . LIPOMA EXCISION N/A 02/23/2014   Procedure: EXCISION LIPOMA ON BACK;  Surgeon: Theodoro Kos, DO;  Location: [place];  Service: Clinical cytogeneticist;  Laterality: N/A;  . NASAL FRACTURE SURGERY  2013    ROS: Review of Systems  Constitutional: Negative for activity change, appetite change and unexpected weight change.  Gastrointestinal: Negative for blood in stool.       Moving bowels okay  Genitourinary: Negative for difficulty urinating and hematuria.  Psychiatric/Behavioral: Negative for dysphoric mood and sleep disturbance. The patient is not nervous/anxious.     PHYSICAL EXAM: BP 129/79   Pulse 88   Temp 98 F (36.7 C) (Oral)   Resp 16   Ht 6\' 1"  (1.854 m)   Wt 206 lb 3.2 oz (93.5 kg)   SpO2 97%   BMI 27.20 kg/m   Wt Readings from Last 3 Encounters:  11/18/18 206 lb 3.2 oz (93.5 kg)  01/22/18 203 lb (92.1 kg)  04/13/17 206 lb (93.4 kg)    Physical Exam  General appearance - alert, well appearing, older African-American male who looks younger than stated age and in no  distress Mental status - normal mood, behavior, speech, dress, motor activity, and thought processes Nose - normal and patent, no erythema, discharge or polyps Mouth - mucous membranes moist, pharynx normal without lesions Neck - supple, no significant adenopathy Chest - clear to auscultation, no wheezes, rales or rhonchi, symmetric air entry Heart - normal rate, regular rhythm, normal S1, S2, no murmurs, rubs, clicks or gallops Extremities -small but serpiginous varicosities in the left popliteal fossa and upper calf.  Fine spider veins on the right lower extremity. MSK: Knees- no joint enlargement noted.  He has very good range of motion of both knee joints. CMP Latest Ref Rng & Units 01/22/2018 10/20/2016 01/10/2016  Glucose 65 - 99 mg/dL 82 82 -  BUN 8 - 27 mg/dL 12 14 -  Creatinine 0.76 - 1.27 mg/dL 1.40(H) 1.35(H) -  Sodium 134 - 144 mmol/L 137 139 -  Potassium 3.5 - 5.2 mmol/L 4.5 4.6 -  Chloride 96 - 106 mmol/L 100 106 -  CO2 20 - 29 mmol/L 23 29 -  Calcium 8.6 - 10.2 mg/dL 9.6 9.7 -  Total Protein 6.0 - 8.5 g/dL 7.2 7.4 6.8  Total Bilirubin 0.0 - 1.2 mg/dL 0.9 1.0 0.9  Alkaline Phos 39 - 117 IU/L 51 42 45  AST 0 - 40 IU/L 23 22 23   ALT 0 - 44 IU/L 19 14 16    Lipid Panel     Component Value Date/Time   CHOL 262 (H) 01/22/2018 1016   TRIG 72 01/22/2018 1016   HDL 60 01/22/2018 1016   CHOLHDL 4.4 01/22/2018 1016   CHOLHDL 4.5 10/20/2016 1146   VLDL 19 10/20/2016 1146   LDLCALC 188 (H) 01/22/2018 1016    CBC    Component Value Date/Time   WBC 4.7 01/22/2018 1016   WBC 4.8 09/30/2015 1117   RBC 4.75 01/22/2018 1016   RBC 4.87 09/30/2015 1117   HGB 13.9 01/22/2018 1016   HCT 41.3 01/22/2018 1016   PLT 262 01/22/2018 1016   MCV 87 01/22/2018 1016   MCH 29.3 01/22/2018 1016   MCH 29.4 09/30/2015 1117   MCHC 33.7 01/22/2018 1016   MCHC 33.4 09/30/2015 1117   RDW 13.0 01/22/2018 1016    ASSESSMENT AND PLAN: 1. Mixed hyperlipidemia Patient would like to have his  cholesterol rechecked before deciding to go back on statin - Lipid panel - Comprehensive metabolic panel  2. 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 Influenza vaccination declined  4. Over weight Went over normal BMI.  Commended him on healthy eating habits.  Advised to avoid sugary drinks and too much white carbohydrates.  Continue regular exercise  5. CKD (chronic kidney disease) stage 2, GFR 60-89 ml/min Patient advised to avoid NSAIDs - Comprehensive metabolic panel    Patient was given the opportunity to ask questions.  Patient verbalized understanding of the plan and was able to repeat key elements of the plan.   No orders of the defined types were placed in this encounter.    Requested Prescriptions    No prescriptions requested or ordered in this encounter    No follow-ups on file.  Karle Plumber, MD, FACP

## 2018-11-18 NOTE — Patient Instructions (Addendum)
I would recommend wearing a compression socks to the lower extremity during the day when you are up and active.  Avoid taking over-the-counter pain medications like ibuprofen, Aleve, Advil or Naprosyn long-term as these can adversely affect your kidneys.  Continue regular exercise.  I would avoid sugary drinks.  Cut back on eating rice and change of bread to whole-grain wheat bread.

## 2018-11-19 LAB — LIPID PANEL
CHOL/HDL RATIO: 4.4 ratio (ref 0.0–5.0)
Cholesterol, Total: 260 mg/dL — ABNORMAL HIGH (ref 100–199)
HDL: 59 mg/dL (ref >39)
LDL CALC: 179 mg/dL — AB (ref 0–99)
TRIGLYCERIDES: 108 mg/dL (ref 0–149)
VLDL CHOLESTEROL CAL: 22 mg/dL (ref 5–40)

## 2018-11-19 LAB — COMPREHENSIVE METABOLIC PANEL
A/G RATIO: 1.3 (ref 1.2–2.2)
ALT: 16 IU/L (ref 0–44)
AST: 22 IU/L (ref 0–40)
Albumin: 4.2 g/dL (ref 3.8–4.8)
Alkaline Phosphatase: 55 IU/L (ref 39–117)
BILIRUBIN TOTAL: 1 mg/dL (ref 0.0–1.2)
BUN/Creatinine Ratio: 9 — ABNORMAL LOW (ref 10–24)
BUN: 13 mg/dL (ref 8–27)
CHLORIDE: 102 mmol/L (ref 96–106)
CO2: 23 mmol/L (ref 20–29)
Calcium: 10 mg/dL (ref 8.6–10.2)
Creatinine, Ser: 1.47 mg/dL — ABNORMAL HIGH (ref 0.76–1.27)
GFR calc Af Amer: 56 mL/min/{1.73_m2} — ABNORMAL LOW (ref >59)
GFR calc non Af Amer: 49 mL/min/{1.73_m2} — ABNORMAL LOW (ref >59)
Globulin, Total: 3.2 g/dL (ref 1.5–4.5)
Glucose: 75 mg/dL (ref 65–99)
Potassium: 4.4 mmol/L (ref 3.5–5.2)
Sodium: 141 mmol/L (ref 134–144)
Total Protein: 7.4 g/dL (ref 6.0–8.5)

## 2018-11-20 ENCOUNTER — Other Ambulatory Visit: Payer: Self-pay | Admitting: Internal Medicine

## 2018-11-20 MED ORDER — ATORVASTATIN CALCIUM 10 MG PO TABS: 10.0000 mg | ORAL_TABLET | Freq: Every day | ORAL | 3 refills | Status: DC

## 2018-11-21 ENCOUNTER — Telehealth: Payer: Self-pay

## 2018-11-21 ENCOUNTER — Other Ambulatory Visit: Payer: Self-pay

## 2018-11-21 MED ORDER — ATORVASTATIN CALCIUM 10 MG PO TABS: 10.0000 mg | ORAL_TABLET | Freq: Every day | ORAL | 3 refills | Status: DC

## 2018-11-21 NOTE — Telephone Encounter (Signed)
Contacted pt to go over lab results pt is aware and doesn't have any questions or concerns 

## 2018-12-05 ENCOUNTER — Telehealth: Payer: Self-pay | Admitting: Internal Medicine

## 2018-12-05 ENCOUNTER — Ambulatory Visit: Payer: Medicare Other | Attending: Internal Medicine | Admitting: Internal Medicine

## 2018-12-05 ENCOUNTER — Encounter: Payer: Self-pay | Admitting: Internal Medicine

## 2018-12-05 VITALS — BP 133/78 | HR 84 | Temp 97.9°F | Resp 16 | Wt 211.4 lb

## 2018-12-05 DIAGNOSIS — L84 Corns and callosities: Secondary | ICD-10-CM | POA: Diagnosis not present

## 2018-12-05 NOTE — Telephone Encounter (Signed)
PT came in stating he wanted to be refered to a pediatrist would prefer it to be Dr.stover.pt forgot to express this during last visit

## 2018-12-05 NOTE — Progress Notes (Signed)
Patient ID: Raymond Franco, male    DOB: April 06, 1951  MRN: [id number]  CC: Foot Pain and Referral   Subjective: Raymond Franco is a 68 y.o. male who presents for same day appointment. His concerns today include:   Pt c/o pain corn RT 2nd toe x few wks.  Has Corn on both big toes for sometime but the one on RT just started bothering him.  He tries to wear good fitting shoes Patient Active Problem List   Diagnosis Date Noted  . CKD (chronic kidney disease) stage 2, GFR 60-89 ml/min 11/18/2018  . Acute pain of both knees 03/29/2017  . Subcutaneous nodules 03/29/2017  . Varicose vein of leg 10/20/2016  . Bunion 10/20/2016  . Hyperlipidemia 10/01/2015     Current Outpatient Medications on File Prior to Visit  Medication Sig Dispense Refill  . atorvastatin (LIPITOR) 10 MG tablet Take 1 tablet (10 mg total) by mouth daily. 90 tablet 3  . Elastic Bandages & Supports (MEDICAL COMPRESSION STOCKINGS) MISC 30 mm Hg pressure (Patient not taking: Reported on 11/18/2018) 2 each 0  . Multiple Vitamin (MULTIVITAMIN) tablet Take 1 tablet by mouth daily.     No current facility-administered medications on file prior to visit.     No Known Allergies  Social History   Socioeconomic History  . Marital status: Divorced    Spouse name: Not on file  . Number of children: Not on file  . Years of education: Not on file  . Highest education level: Not on file  Occupational History  . Not on file  Social Needs  . Financial resource strain: Not on file  . Food insecurity:    Worry: Not on file    Inability: Not on file  . Transportation needs:    Medical: Not on file    Non-medical: Not on file  Tobacco Use  . Smoking status: Never Smoker  . Smokeless tobacco: Never Used  Substance and Sexual Activity  . Alcohol use: Yes    Comment: RARE  . Drug use: No  . Sexual activity: Not on file  Lifestyle  . Physical activity:    Days per week: Not on file    Minutes per session: Not on file  .  Stress: Not on file  Relationships  . Social connections:    Talks on phone: Not on file    Gets together: Not on file    Attends religious service: Not on file    Active member of club or organization: Not on file    Attends meetings of clubs or organizations: Not on file    Relationship status: Not on file  . Intimate partner violence:    Fear of current or ex partner: Not on file    Emotionally abused: Not on file    Physically abused: Not on file    Forced sexual activity: Not on file  Other Topics Concern  . Not on file  Social History Narrative  . Not on file    No family history on file.  Past Surgical History:  Procedure Laterality Date  . LIPOMA EXCISION N/A 02/23/2014   Procedure: EXCISION LIPOMA ON BACK;  Surgeon: Theodoro Kos, DO;  Location: [facility] [place];  Service: Plastics;  Laterality: N/A;  . NASAL FRACTURE SURGERY  2013    ROS: Review of Systems Negative except as stated above  PHYSICAL EXAM: BP 133/78   Pulse 84   Temp 97.9 F (36.6 C) (Oral)   Resp  16   Wt 211 lb 6.4 oz (95.9 kg)   SpO2 97%   BMI 27.89 kg/m   Physical Exam   General appearance - alert, well appearing, and in no distress Mental status - normal mood, behavior, speech, dress, motor activity, and thought processes Skin - moderate size corn on both 2nd toes RT larger than LT. no erythema or edema.  CMP Latest Ref Rng & Units 11/18/2018 01/22/2018 10/20/2016  Glucose 65 - 99 mg/dL 75 82 82  BUN 8 - 27 mg/dL 13 12 14   Creatinine 0.76 - 1.27 mg/dL 1.47(H) 1.40(H) 1.35(H)  Sodium 134 - 144 mmol/L 141 137 139  Potassium 3.5 - 5.2 mmol/L 4.4 4.5 4.6  Chloride 96 - 106 mmol/L 102 100 106  CO2 20 - 29 mmol/L 23 23 29   Calcium 8.6 - 10.2 mg/dL 10.0 9.6 9.7  Total Protein 6.0 - 8.5 g/dL 7.4 7.2 7.4  Total Bilirubin 0.0 - 1.2 mg/dL 1.0 0.9 1.0  Alkaline Phos 39 - 117 IU/L 55 51 42  AST 0 - 40 IU/L 22 23 22   ALT 0 - 44 IU/L 16 19 14    Lipid Panel     Component Value  Date/Time   CHOL 260 (H) 11/18/2018 1638   TRIG 108 11/18/2018 1638   HDL 59 11/18/2018 1638   CHOLHDL 4.4 11/18/2018 1638   CHOLHDL 4.5 10/20/2016 1146   VLDL 19 10/20/2016 1146   LDLCALC 179 (H) 11/18/2018 1638    CBC    Component Value Date/Time   WBC 4.7 01/22/2018 1016   WBC 4.8 09/30/2015 1117   RBC 4.75 01/22/2018 1016   RBC 4.87 09/30/2015 1117   HGB 13.9 01/22/2018 1016   HCT 41.3 01/22/2018 1016   PLT 262 01/22/2018 1016   MCV 87 01/22/2018 1016   MCH 29.3 01/22/2018 1016   MCH 29.4 09/30/2015 1117   MCHC 33.7 01/22/2018 1016   MCHC 33.4 09/30/2015 1117   RDW 13.0 01/22/2018 1016    ASSESSMENT AND PLAN: 1. Corn of toe Recommend trying some Dr. Felicie Morn pads on the corn until he gets into see podiatry.  Avoid tight fitting shoes. Printed information given on diagnosis - Ambulatory referral to Podiatry    Patient was given the opportunity to ask questions.  Patient verbalized understanding of the plan and was able to repeat key elements of the plan.   Orders Placed This Encounter  Procedures  . Ambulatory referral to Podiatry     Requested Prescriptions    No prescriptions requested or ordered in this encounter    No follow-ups on file.  Karle Plumber, MD, FACP

## 2018-12-05 NOTE — Patient Instructions (Signed)

## 2018-12-05 NOTE — Progress Notes (Signed)
Pt states his second toe on the right foot is bigger than the big toe  Pt states he has corns on b/l feet

## 2018-12-06 NOTE — Telephone Encounter (Signed)
Pt referral was sent to Dr. Cannon Kettle at [facility]

## 2019-03-20 ENCOUNTER — Other Ambulatory Visit: Payer: Self-pay

## 2019-03-20 ENCOUNTER — Encounter: Payer: Self-pay | Admitting: Internal Medicine

## 2019-03-20 ENCOUNTER — Ambulatory Visit: Payer: Medicare Other | Attending: Internal Medicine | Admitting: Internal Medicine

## 2019-03-20 DIAGNOSIS — E785 Hyperlipidemia, unspecified: Secondary | ICD-10-CM | POA: Diagnosis not present

## 2019-03-20 DIAGNOSIS — N183 Chronic kidney disease, stage 3 unspecified: Secondary | ICD-10-CM

## 2019-03-20 DIAGNOSIS — M7989 Other specified soft tissue disorders: Secondary | ICD-10-CM | POA: Diagnosis not present

## 2019-03-20 DIAGNOSIS — I8393 Asymptomatic varicose veins of bilateral lower extremities: Secondary | ICD-10-CM

## 2019-03-20 DIAGNOSIS — M79662 Pain in left lower leg: Secondary | ICD-10-CM | POA: Diagnosis not present

## 2019-03-20 NOTE — Progress Notes (Signed)
Virtual Visit via Telephone Note Due to current restrictions/limitations of in-office visits due to the COVID-19 pandemic, this scheduled clinical appointment was converted to a telehealth visit  I connected with Raymond Franco on 03/20/19 at 4:08 p.m by telephone and verified that I am speaking with the correct person using two identifiers. I am in my office.  The patient is at home.  Only the patient and myself participated in this encounter.  I discussed the limitations, risks, security and privacy concerns of performing an evaluation and management service by telephone and the availability of in person appointments. I also discussed with the patient that there may be a patient responsible charge related to this service. The patient expressed understanding and agreed to proceed.   History of Present Illness: Patient with history of HL, CKD stage II-III, varicose vein.  Patient last seen 11/2018  Pt c/o "a little pain in the LT leg"  since mid March.  Located a little above and below the knee and occurs early mornings and late evenings.  Wonders if due to the varicose veins.  Denies any injury to the leg.  He has not had any recent long distance travel.  Has had more swelling than usual in this leg and more so than RT side.  Nothing makes it worse.  Elevation helps some. No pain in the knee Never filled rxn for compression socks  HL: Total and LDL cholesterol were elevated on blood test done on last visit.  I would recommend that he restart the Lipitor but patient never picked up the prescription.  He feels he does okay with his eating habits.  CKD: Estimated GFR had decreased slightly from 60 to 56 on blood test done on last visit.  Patient is not on any NSAIDs.  He still makes good urine.  Outpatient Encounter Medications as of 03/20/2019  Medication Sig  . atorvastatin (LIPITOR) 10 MG tablet Take 1 tablet (10 mg total) by mouth daily.  . Elastic Bandages & Supports (MEDICAL COMPRESSION  STOCKINGS) MISC 30 mm Hg pressure (Patient not taking: Reported on 11/18/2018)  . Multiple Vitamin (MULTIVITAMIN) tablet Take 1 tablet by mouth daily.   No facility-administered encounter medications on file as of 03/20/2019.       Observations/Objective: No direct observation done as this was a telephone encounter. Results for orders placed or performed in visit on 11/18/18  Lipid panel  Result Value Ref Range   Cholesterol, Total 260 (H) 100 - 199 mg/dL   Triglycerides 108 0 - 149 mg/dL   HDL 59 >39 mg/dL   VLDL Cholesterol Cal 22 5 - 40 mg/dL   LDL Calculated 179 (H) 0 - 99 mg/dL   Chol/HDL Ratio 4.4 0.0 - 5.0 ratio  Comprehensive metabolic panel  Result Value Ref Range   Glucose 75 65 - 99 mg/dL   BUN 13 8 - 27 mg/dL   Creatinine, Ser 1.47 (H) 0.76 - 1.27 mg/dL   GFR calc non Af Amer 49 (L) >59 mL/min/1.73   GFR calc Af Amer 56 (L) >59 mL/min/1.73   BUN/Creatinine Ratio 9 (L) 10 - 24   Sodium 141 134 - 144 mmol/L   Potassium 4.4 3.5 - 5.2 mmol/L   Chloride 102 96 - 106 mmol/L   CO2 23 20 - 29 mmol/L   Calcium 10.0 8.6 - 10.2 mg/dL   Total Protein 7.4 6.0 - 8.5 g/dL   Albumin 4.2 3.8 - 4.8 g/dL   Globulin, Total 3.2 1.5 - 4.5 g/dL  Albumin/Globulin Ratio 1.3 1.2 - 2.2   Bilirubin Total 1.0 0.0 - 1.2 mg/dL   Alkaline Phosphatase 55 39 - 117 IU/L   AST 22 0 - 40 IU/L   ALT 16 0 - 44 IU/L   The 10-year ASCVD risk score Mikey Bussing DC Jr., et al., 2013) is: 11.7%   Values used to calculate the score:     Age: 68 years     Sex: Male     Is Non-Hispanic African American: Yes     Diabetic: No     Tobacco smoker: No     Systolic Blood Pressure: 115 mmHg     Is BP treated: No     HDL Cholesterol: 59 mg/dL     Total Cholesterol: 260 mg/dL  Assessment and Plan: 1. Pain and swelling of left lower leg -Most likely due to varicose veins.  However because he no pain and swelling more than usual we will go ahead and get a Doppler ultrasound.  I have encouraged him to purchase some  compression socks and wear them during the day - VAS [place] LOWER EXTREMITY VENOUS (DVT); Future  2. Hyperlipidemia, unspecified hyperlipidemia type Discussed his cholesterol results.  Given his ASCVD score, I recommend starting Lipitor to help lower the cholesterol.  Patient is agreeable to doing so.  He will pick up the prescription  3. Asymptomatic varicose veins of both lower extremities See #1 above  4. CKD (chronic kidney disease) stage 3, GFR 30-59 ml/min (HCC) Told to avoid NSAIDs   Follow Up Instructions: 4 mths   I discussed the assessment and treatment plan with the patient. The patient was provided an opportunity to ask questions and all were answered. The patient agreed with the plan and demonstrated an understanding of the instructions.   The patient was advised to call back or seek an in-person evaluation if the symptoms worsen or if the condition fails to improve as anticipated.  I provided 12 minutes of non-face-to-face time during this encounter.   Karle Plumber, MD

## 2019-03-20 NOTE — Addendum Note (Signed)
Addended by: Karle Plumber B on: 03/20/2019 05:42 PM   Modules accepted: Level of Service

## 2019-03-28 ENCOUNTER — Other Ambulatory Visit: Payer: Self-pay

## 2019-03-28 ENCOUNTER — Ambulatory Visit (HOSPITAL_COMMUNITY)
Admission: RE | Admit: 2019-03-28 | Discharge: 2019-03-28 | Disposition: A | Discharge status: Home / Self Care | Payer: Medicare Other | Source: Ambulatory Visit | Attending: Internal Medicine | Admitting: Internal Medicine

## 2019-03-28 DIAGNOSIS — M79662 Pain in left lower leg: Secondary | ICD-10-CM

## 2019-03-28 DIAGNOSIS — M7989 Other specified soft tissue disorders: Secondary | ICD-10-CM | POA: Diagnosis not present

## 2019-03-31 ENCOUNTER — Telehealth: Payer: Self-pay

## 2019-03-31 NOTE — Telephone Encounter (Signed)
Contacted pt to go over [place] results pt didn't answer left a detailed vm informing pt of results and if he has any questions or concerns to give me a call

## 2019-05-09 ENCOUNTER — Other Ambulatory Visit: Payer: Self-pay | Admitting: Nurse Practitioner

## 2019-05-09 DIAGNOSIS — Z135 Encounter for screening for eye and ear disorders: Secondary | ICD-10-CM

## 2019-05-12 ENCOUNTER — Ambulatory Visit: Payer: Medicare Other | Attending: Nurse Practitioner | Admitting: Nurse Practitioner

## 2019-05-12 ENCOUNTER — Encounter: Payer: Self-pay | Admitting: Nurse Practitioner

## 2019-05-12 ENCOUNTER — Other Ambulatory Visit: Payer: Self-pay

## 2019-05-12 VITALS — BP 115/72 | HR 69 | Temp 98.7°F | Ht 73.0 in | Wt 200.2 lb

## 2019-05-12 DIAGNOSIS — H00022 Hordeolum internum right lower eyelid: Secondary | ICD-10-CM

## 2019-05-12 MED ORDER — BACITRACIN-POLYMYXIN B 500-10000 UNIT/GM OP OINT: 1.0000 "application " | TOPICAL_OINTMENT | Freq: Two times a day (BID) | OPHTHALMIC | 0 refills | Status: DC

## 2019-05-12 NOTE — Progress Notes (Signed)
Assessment & Plan:  Raymond Franco was seen today for stye.  Diagnoses and all orders for this visit:  Hordeolum internum of right lower eyelid -     bacitracin-polymyxin b (POLYSPORIN) ophthalmic ointment; Place 1 application into the right eye every 12 (twelve) hours for 10 days. apply to eye every 12 hours while awake Instructed to contact his ophthalmologist for urgent evaluation or go to the Emergency room for any sudden visual changes, worsening pain or photophobia of new onset  Patient has been counseled on age-appropriate routine health concerns for screening and prevention. These are reviewed and up-to-date. Referrals have been placed accordingly. Immunizations are up-to-date or declined.    Subjective:   Chief Complaint  Patient presents with   Stye    Pt.have a bump on his right upper eye lid. He stated he felt dust going into his eye at work.  Pt. stated no pain and he tried warm compressed on it over tht weekend.    Eye Problem  The right eye is affected. 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 The problem occurs constantly. The problem has been gradually improving (with warm compresses). The injury mechanism was a foreign body. The pain is at a severity of 3/10. The pain is mild. There is no known exposure to pink eye. He does not wear contacts. Associated symptoms include eye redness (upper eyelid). Pertinent negatives include no blurred vision, eye discharge, double vision, fever, foreign body sensation, itching, nausea, photophobia, recent URI or vomiting. Treatments tried: see above. The treatment provided mild relief.      Review of Systems  Constitutional: Negative for fever, malaise/fatigue and weight loss.  HENT: Negative.  Negative for nosebleeds.   Eyes: Positive for pain (upper eyelid) and redness (upper eyelid). Negative for blurred vision, double vision,  photophobia, discharge and itching.  Respiratory: Negative.  Negative for cough and shortness of breath.   Cardiovascular: Negative.  Negative for chest pain, palpitations and leg swelling.  Gastrointestinal: Negative for nausea and vomiting.  Neurological: Negative.  Negative for dizziness, focal weakness, seizures and headaches.  Psychiatric/Behavioral: Negative.  Negative for suicidal ideas.    Past Medical History:  Diagnosis Date   Lipoma     Past Surgical History:  Procedure Laterality Date   LIPOMA EXCISION N/A 02/23/2014   Procedure: EXCISION LIPOMA ON BACK;  Surgeon: Theodoro Kos, DO;  Location: [place];  Service: Plastics;  Laterality: N/A;   NASAL FRACTURE SURGERY  2013    History reviewed. No pertinent family history.  Social History Reviewed with no changes to be made today.   Outpatient Medications Prior to Visit  Medication Sig Dispense Refill   atorvastatin (LIPITOR) 10 MG tablet Take 1 tablet (10 mg total) by mouth daily. 90 tablet 3   Multiple Vitamin (MULTIVITAMIN) tablet Take 1 tablet by mouth daily.     Elastic Bandages & Supports (MEDICAL COMPRESSION STOCKINGS) MISC 30 mm Hg pressure (Patient not taking: Reported on 11/18/2018) 2 each 0   No facility-administered medications prior to visit.     No Known Allergies     Objective:    BP 115/72 (BP Location: Left Arm, Patient Position: Sitting, Cuff Size: Normal)    Pulse 69    Temp 98.7 F (37.1 C) (Oral)    Ht 6\' 1"  (1.854 m)    Wt 200 lb 3.2 oz (90.8 kg)  SpO2 100%    BMI 26.41 kg/m  Wt Readings from Last 3 Encounters:  05/12/19 200 lb 3.2 oz (90.8 kg)  12/05/18 211 lb 6.4 oz (95.9 kg)  11/18/18 206 lb 3.2 oz (93.5 kg)    Physical Exam Vitals signs and nursing note reviewed.  Constitutional:      Appearance: He is well-developed.  HENT:     Head: Normocephalic and atraumatic.  Eyes:     General: Lids are everted, no foreign bodies appreciated. Gaze aligned  appropriately. Visual field deficit present.        Right eye: Hordeolum present. No foreign body or discharge.        Left eye: No foreign body, discharge or hordeolum.     Extraocular Movements: Extraocular movements intact.     Conjunctiva/sclera: Conjunctivae normal.     Right eye: Right conjunctiva is not injected. No chemosis, exudate or hemorrhage.    Left eye: Left conjunctiva is not injected. No chemosis, exudate or hemorrhage. Neck:     Musculoskeletal: Normal range of motion.  Cardiovascular:     Rate and Rhythm: Normal rate and regular rhythm.     Heart sounds: Normal heart sounds. No murmur. No friction rub. No gallop.   Pulmonary:     Effort: Pulmonary effort is normal. No tachypnea or respiratory distress.     Breath sounds: Normal breath sounds. No decreased breath sounds, wheezing, rhonchi or rales.  Chest:     Chest wall: No tenderness.  Abdominal:     Palpations: Abdomen is soft.  Musculoskeletal: Normal range of motion.  Skin:    General: Skin is warm and dry.  Neurological:     Mental Status: He is alert and oriented to person, place, and time.     Coordination: Coordination normal.  Psychiatric:        Behavior: Behavior normal. Behavior is cooperative.        Thought Content: Thought content normal.        Judgment: Judgment normal.          Patient has been counseled extensively about nutrition and exercise as well as the importance of adherence with medications and regular follow-up. The patient was given clear instructions to go to ER or return to medical center if symptoms don't improve, worsen or new problems develop. The patient verbalized understanding.   Follow-up: Return if symptoms worsen or fail to improve.   Gildardo Pounds, FNP-BC [facility] [place], [place]   05/12/2019, 5:03 PM

## 2019-05-12 NOTE — Patient Instructions (Signed)

## 2019-05-15 ENCOUNTER — Telehealth: Payer: Self-pay | Admitting: Pharmacist

## 2019-05-15 NOTE — Telephone Encounter (Signed)
Received request from Eva to change patient's polysporin ointment. The pharmacy cannot obtain. Will send to PCP for therapeutic change.

## 2019-05-19 ENCOUNTER — Other Ambulatory Visit: Payer: Self-pay | Admitting: Nurse Practitioner

## 2019-05-19 MED ORDER — AZITHROMYCIN 1 % OP SOLN: 1.0000 [drp] | Freq: Two times a day (BID) | OPHTHALMIC | 0 refills | Status: AC

## 2019-06-12 ENCOUNTER — Ambulatory Visit: Payer: Medicare Other | Admitting: Internal Medicine

## 2019-11-02 ENCOUNTER — Ambulatory Visit: Payer: Medicare Other | Attending: Internal Medicine

## 2019-11-02 DIAGNOSIS — Z23 Encounter for immunization: Secondary | ICD-10-CM

## 2019-11-04 NOTE — Progress Notes (Signed)
   Covid-19 Vaccination Clinic  Name:  Raymond Franco    MRN: [id number] DOB: 06-22-1951  11/02/2019  [name] was observed post Covid-19 immunization for 15 minutes without incidence. He was provided with Vaccine Information Sheet and instruction to access the V-Safe system.   [name] was instructed to call 911 with any severe reactions post vaccine: Marland Kitchen Difficulty breathing  . Swelling of your face and throat  . A fast heartbeat  . A bad rash all over your body  . Dizziness and weakness    Immunizations Administered    Name Date Dose VIS Date Route   Moderna COVID-19 Vaccine 11/02/2019  5:06 PM 0.5 mL 09/09/2019 Intramuscular   Manufacturer: Levan Hurst   LotJE:[id number]   [id number]      Documented on behalf of:  Tia Masker, MD

## 2019-11-30 ENCOUNTER — Ambulatory Visit: Payer: Medicare HMO | Attending: Internal Medicine

## 2019-11-30 DIAGNOSIS — Z23 Encounter for immunization: Secondary | ICD-10-CM | POA: Insufficient documentation

## 2019-11-30 NOTE — Progress Notes (Signed)
   Covid-19 Vaccination Clinic  Name:  Raymond Franco    MRN: [id number] DOB: 1950-10-12  11/30/2019  [name] was observed post Covid-19 immunization for 15 minutes without incidence. He was provided with Vaccine Information Sheet and instruction to access the V-Safe system.   [name] was instructed to call 911 with any severe reactions post vaccine: Marland Kitchen Difficulty breathing  . Swelling of your face and throat  . A fast heartbeat  . A bad rash all over your body  . Dizziness and weakness

## 2020-01-28 ENCOUNTER — Encounter: Payer: Self-pay | Admitting: Internal Medicine

## 2020-01-28 NOTE — Progress Notes (Signed)
Patient had a home visit from [facility] nurse practitioner Stephanie Martinique on 10/05/2019.  He had screening ABIs of the lower extremities.  ABI on the right was 1.26 and on the left 0.97.

## 2020-02-24 ENCOUNTER — Encounter: Payer: Self-pay | Admitting: Internal Medicine

## 2020-02-24 ENCOUNTER — Ambulatory Visit: Payer: Medicare HMO | Attending: Internal Medicine | Admitting: Internal Medicine

## 2020-02-24 ENCOUNTER — Other Ambulatory Visit: Payer: Self-pay

## 2020-02-24 VITALS — BP 113/77 | HR 93 | Temp 95.9°F | Resp 16 | Ht 73.0 in | Wt 193.8 lb

## 2020-02-24 DIAGNOSIS — R197 Diarrhea, unspecified: Secondary | ICD-10-CM

## 2020-02-24 DIAGNOSIS — Z125 Encounter for screening for malignant neoplasm of prostate: Secondary | ICD-10-CM | POA: Diagnosis not present

## 2020-02-24 DIAGNOSIS — N1831 Chronic kidney disease, stage 3a: Secondary | ICD-10-CM | POA: Diagnosis not present

## 2020-02-24 DIAGNOSIS — E782 Mixed hyperlipidemia: Secondary | ICD-10-CM | POA: Diagnosis not present

## 2020-02-24 DIAGNOSIS — K51911 Ulcerative colitis, unspecified with rectal bleeding: Secondary | ICD-10-CM

## 2020-02-24 DIAGNOSIS — E663 Overweight: Secondary | ICD-10-CM | POA: Diagnosis not present

## 2020-02-24 DIAGNOSIS — Z2821 Immunization not carried out because of patient refusal: Secondary | ICD-10-CM | POA: Diagnosis not present

## 2020-02-24 NOTE — Progress Notes (Signed)
Patient ID: Raymond Franco, male    DOB: 11-Mar-1951  MRN: [id number]  CC: Referral   Subjective: Raymond Franco is a 69 y.o. male who presents for chronic ds management His concerns today include:  Patient with history of HL, CKD stage II-III, varicose vein.  Last eval by me 03/2019  Pt reports drinking more sodas over the past 1 mth.  Thinks it has infected his GI tract because he started having diarrhea for the past 5-7 days. Slight abdominal cramps at the beginning. Reports having 2-3 BMs a day, very loose. More recently they have become soft. Some blood in the stools for the first 2-3 days.  No fever. No wgh loss during this period -no recent abx use.  No new meds including CAM.  No recent international travel.  -dx with Ulcertive Colitis in [id number].  Lived in [place] at the time.  Had c-scope 2017 through [place] in a clinical trial.  C-scope report stated: Patchy mild inflammation in the rectosigmoid colon secondary to proctosigmoid ulcerative colitis and secondary to quiescent ulcerative colitis.  Recommended that he have repeat colonoscopy in 5 years.  Loss 7 lbs since 05/2019.  Pt states this was intentional.  Goes to gym 4 x a wk. Exercises on elipitical for 1 hr and some wgh training. Has a fruit smoothie after exercise.  Eats dinner before 6 p.m  HL:  Was taking Lipitor but stopped 6 mths ago.  Just does not like taking meds.   Patient had a home visit from [facility] nurse practitioner Stephanie Martinique on 10/05/2019.  He had screening ABIs of the lower extremities.  ABI on the right was 1.26 and on the left 0.97. Pt denies pain in legs or feet with walking.  No skin discoloration.   Patient Active Problem List   Diagnosis Date Noted  . Ulcerative colitis with rectal bleeding ([place]) 02/24/2020  . Corn of toe 12/05/2018  . CKD (chronic kidney disease) stage 2, GFR 60-89 ml/min 11/18/2018  . Acute pain of both knees 03/29/2017  . Subcutaneous nodules 03/29/2017  . Varicose vein of  leg 10/20/2016  . Bunion 10/20/2016  . Hyperlipidemia 10/01/2015     Current Outpatient Medications on File Prior to Visit  Medication Sig Dispense Refill  . atorvastatin (LIPITOR) 10 MG tablet Take 1 tablet (10 mg total) by mouth daily. 90 tablet 3  . Elastic Bandages & Supports (MEDICAL COMPRESSION STOCKINGS) MISC 30 mm Hg pressure (Patient not taking: Reported on 11/18/2018) 2 each 0  . Multiple Vitamin (MULTIVITAMIN) tablet Take 1 tablet by mouth daily.     No current facility-administered medications on file prior to visit.    No Known Allergies  Social History   Socioeconomic History  . Marital status: Single    Spouse name: Not on file  . Number of children: Not on file  . Years of education: Not on file  . Highest education level: Not on file  Occupational History  . Not on file  Tobacco Use  . Smoking status: Never Smoker  . Smokeless tobacco: Never Used  Substance and Sexual Activity  . Alcohol use: Yes    Comment: RARE  . Drug use: No  . Sexual activity: Not on file  Other Topics Concern  . Not on file  Social History Narrative  . Not on file   Social Determinants of Health   Financial Resource Strain:   . Difficulty of Paying Living Expenses:   Food Insecurity:   . Worried  About Running Out of Food in the Last Year:   . [place] in the Last Year:   Transportation Needs:   . Lack of Transportation (Medical):   Marland Kitchen Lack of Transportation (Non-Medical):   Physical Activity:   . Days of Exercise per Week:   . Minutes of Exercise per Session:   Stress:   . Feeling of Stress :   Social Connections:   . Frequency of Communication with Friends and Family:   . Frequency of Social Gatherings with Friends and Family:   . Attends Religious Services:   . Active Member of Clubs or Organizations:   . Attends Archivist Meetings:   Marland Kitchen Marital Status:   Intimate Partner Violence:   . Fear of Current or Ex-Partner:   . Emotionally Abused:   Marland Kitchen  Physically Abused:   . Sexually Abused:     No family history on file.  Past Surgical History:  Procedure Laterality Date  . LIPOMA EXCISION N/A 02/23/2014   Procedure: EXCISION LIPOMA ON BACK;  Surgeon: Theodoro Kos, DO;  Location: [place];  Service: Plastics;  Laterality: N/A;  . NASAL FRACTURE SURGERY  2013    ROS: Review of Systems Negative except as stated above  PHYSICAL EXAM: BP 113/77   Pulse 93   Temp (!) 95.9 F (35.5 C)   Resp 16   Ht 6\' 1"  ([id number] m)   Wt 193 lb 12.8 oz (87.9 kg)   SpO2 96%   BMI 25.57 kg/m   Wt Readings from Last 3 Encounters:  02/24/20 193 lb 12.8 oz (87.9 kg)  05/12/19 200 lb 3.2 oz (90.8 kg)  12/05/18 211 lb 6.4 oz (95.9 kg)    Physical Exam General appearance - alert, well appearing, older African-American male and in no distress Mental status - normal mood, behavior, speech, dress, motor activity, and thought processes Eyes - pupils equal and reactive, extraocular eye movements intact Mouth - mucous membranes moist, pharynx normal without lesions Chest - clear to auscultation, no wheezes, rales or rhonchi, symmetric air entry Heart - normal rate, regular rhythm, normal S1, S2, no murmurs, rubs, clicks or gallops Abdomen - soft, nontender, nondistended, no masses or organomegaly Extremities - peripheral pulses normal (3+ on DP and PT bilaterally), no pedal edema, no clubbing or cyanosis  CMP Latest Ref Rng & Units 11/18/2018 01/22/2018 10/20/2016  Glucose 65 - 99 mg/dL 75 82 82  BUN 8 - 27 mg/dL 13 12 14   Creatinine 0.76 - 1.27 mg/dL 1.47(H) 1.40(H) 1.35(H)  Sodium 134 - 144 mmol/L 141 137 139  Potassium 3.5 - 5.2 mmol/L 4.4 4.5 4.6  Chloride 96 - 106 mmol/L 102 100 106  CO2 20 - 29 mmol/L 23 23 29   Calcium 8.6 - 10.2 mg/dL 10.0 9.6 9.7  Total Protein 6.0 - 8.5 g/dL 7.4 7.2 7.4  Total Bilirubin 0.0 - 1.2 mg/dL 1.0 0.9 1.0  Alkaline Phos 39 - 117 IU/L 55 51 42  AST 0 - 40 IU/L 22 23 22   ALT 0 - 44 IU/L 16 19 14     Lipid Panel     Component Value Date/Time   CHOL 260 (H) 11/18/2018 1638   TRIG 108 11/18/2018 1638   HDL 59 11/18/2018 1638   CHOLHDL 4.4 11/18/2018 1638   CHOLHDL 4.5 10/20/2016 1146   VLDL 19 10/20/2016 1146   LDLCALC 179 (H) 11/18/2018 1638    CBC    Component Value Date/Time   WBC 4.7 01/22/2018  1016   WBC 4.8 09/30/2015 1117   RBC 4.75 01/22/2018 1016   RBC 4.87 09/30/2015 1117   HGB 13.9 01/22/2018 1016   HCT 41.3 01/22/2018 1016   PLT 262 01/22/2018 1016   MCV 87 01/22/2018 1016   MCH 29.3 01/22/2018 1016   MCH 29.4 09/30/2015 1117   MCHC 33.7 01/22/2018 1016   MCHC 33.4 09/30/2015 1117   RDW 13.0 01/22/2018 1016    ASSESSMENT AND PLAN: 1. Acute diarrhea 2. Ulcerative colitis with rectal bleeding, unspecified location [facility]) -Prior to today, however was not aware that patient was diagnosed with ulcerative colitis in the past.  Current episode that he is having likely mild flare versus viral or bacterial gastroenteritis.  Symptoms are improving so I will not prescribe any steroids at this time.  I will refer to gastroenterology.  Patient informed that patients with IBD are at higher risk for colon cancer and have to be screened more often than persons at average risk. - Ambulatory referral to Gastroenterology - Stool culture   3. Stage 3a chronic kidney disease - CBC With Differential - Comprehensive metabolic panel  4. Mixed hyperlipidemia Lipitor taken off med list since he is not taking.  However I did inform him that high cholesterol can put one at rest for cardiovascular disease - Lipid panel  5. Over weight Commended him on weight loss.  Discussed and encourage healthy eating habits.  Advised to avoid sugary drinks like sodas, juices and sweet tea.  He will continue regular exercise.  6. Prostate cancer screening Discussed prostate cancer screening.  Patient agreeable to having PSA checked - PSA  7. Pneumococcal vaccination declined He is due for  Prevnar 13.  Patient declined  On exam today patient has normal dorsalis pedis and posterior tibialis pulses and does not have any symptoms to suggest PAD.   Patient was given the opportunity to ask questions.  Patient verbalized understanding of the plan and was able to repeat key elements of the plan.   Orders Placed This Encounter  Procedures  . Stool culture  . CBC With Differential  . Comprehensive metabolic panel  . Lipid panel  . PSA  . Ambulatory referral to Gastroenterology     Requested Prescriptions    No prescriptions requested or ordered in this encounter    Return in about 6 months (around 08/26/2020).  Karle Plumber, MD, FACP

## 2020-02-25 LAB — COMPREHENSIVE METABOLIC PANEL
ALT: 12 IU/L (ref 0–44)
AST: 22 IU/L (ref 0–40)
Albumin/Globulin Ratio: 1.3 (ref 1.2–2.2)
Albumin: 4.3 g/dL (ref 3.8–4.8)
Alkaline Phosphatase: 70 IU/L (ref 48–121)
BUN/Creatinine Ratio: 9 — ABNORMAL LOW (ref 10–24)
BUN: 14 mg/dL (ref 8–27)
Bilirubin Total: 1.1 mg/dL (ref 0.0–1.2)
CO2: 22 mmol/L (ref 20–29)
Calcium: 10 mg/dL (ref 8.6–10.2)
Chloride: 104 mmol/L (ref 96–106)
Creatinine, Ser: 1.5 mg/dL — ABNORMAL HIGH (ref 0.76–1.27)
GFR calc Af Amer: 55 mL/min/{1.73_m2} — ABNORMAL LOW (ref >59)
GFR calc non Af Amer: 47 mL/min/{1.73_m2} — ABNORMAL LOW (ref >59)
Globulin, Total: 3.2 g/dL (ref 1.5–4.5)
Glucose: 74 mg/dL (ref 65–99)
Potassium: 4 mmol/L (ref 3.5–5.2)
Sodium: 141 mmol/L (ref 134–144)
Total Protein: 7.5 g/dL (ref 6.0–8.5)

## 2020-02-25 LAB — CBC WITH DIFFERENTIAL
Basophils Absolute: 0 10*3/uL (ref 0.0–0.2)
Basos: 0 %
EOS (ABSOLUTE): 0.1 10*3/uL (ref 0.0–0.4)
Eos: 1 %
Hematocrit: 42.6 % (ref 37.5–51.0)
Hemoglobin: 14.6 g/dL (ref 13.0–17.7)
Immature Grans (Abs): 0 10*3/uL (ref 0.0–0.1)
Immature Granulocytes: 0 %
Lymphocytes Absolute: 1.5 10*3/uL (ref 0.7–3.1)
Lymphs: 17 %
MCH: 30.2 pg (ref 26.6–33.0)
MCHC: 34.3 g/dL (ref 31.5–35.7)
MCV: 88 fL (ref 79–97)
Monocytes Absolute: 0.5 10*3/uL (ref 0.1–0.9)
Monocytes: 6 %
Neutrophils Absolute: 6.8 10*3/uL (ref 1.4–7.0)
Neutrophils: 76 %
RBC: 4.84 x10E6/uL (ref 4.14–5.80)
RDW: 11.6 % (ref 11.6–15.4)
WBC: 9 10*3/uL (ref 3.4–10.8)

## 2020-02-25 LAB — PSA: Prostate Specific Ag, Serum: 1.4 ng/mL (ref 0.0–4.0)

## 2020-02-25 LAB — LIPID PANEL
Chol/HDL Ratio: 4.7 ratio (ref 0.0–5.0)
Cholesterol, Total: 232 mg/dL — ABNORMAL HIGH (ref 100–199)
HDL: 49 mg/dL (ref >39)
LDL Chol Calc (NIH): 168 mg/dL — ABNORMAL HIGH (ref 0–99)
Triglycerides: 86 mg/dL (ref 0–149)
VLDL Cholesterol Cal: 15 mg/dL (ref 5–40)

## 2020-02-25 NOTE — Progress Notes (Signed)
Let patient know that his blood count is normal meaning no anemia.  Kidney function is not 100% but stable compared to when last checked in February 2020.  Liver enzymes are normal.  Cholesterol still elevated but improved from last year.  Total cholesterol is 232 with goal being less than 200.  LDL cholesterol was 168 with goal being less than 100.  Continue healthy eating habits and regular exercise since he does not want to be on cholesterol-lowering medication.  PSA which is the screening test for prostate cancer was normal. The 10-year ASCVD risk score Mikey Bussing DC Brooke Bonito., et al., 2013) is: 9.3%   Values used to calculate the score:     Age: 69 years     Sex: Male     Is Non-Hispanic African American: Yes     Diabetic: No     Tobacco smoker: No     Systolic Blood Pressure: 123456 mmHg     Is BP treated: No     HDL Cholesterol: 49 mg/dL     Total Cholesterol: 232 mg/dL

## 2020-02-28 ENCOUNTER — Telehealth: Payer: Self-pay

## 2020-02-28 NOTE — Telephone Encounter (Signed)
Contacted pt to go over lab results pt is aware and doesn't have any questions or concerns 

## 2020-02-29 LAB — STOOL CULTURE: E coli, Shiga toxin Assay: NEGATIVE

## 2020-03-03 DIAGNOSIS — E782 Mixed hyperlipidemia: Secondary | ICD-10-CM | POA: Diagnosis not present

## 2020-03-03 DIAGNOSIS — R197 Diarrhea, unspecified: Secondary | ICD-10-CM | POA: Diagnosis not present

## 2020-03-03 DIAGNOSIS — K5190 Ulcerative colitis, unspecified, without complications: Secondary | ICD-10-CM | POA: Diagnosis not present

## 2020-03-23 DIAGNOSIS — K529 Noninfective gastroenteritis and colitis, unspecified: Secondary | ICD-10-CM | POA: Diagnosis not present

## 2020-03-23 DIAGNOSIS — K5100 Ulcerative (chronic) pancolitis without complications: Secondary | ICD-10-CM | POA: Diagnosis not present

## 2020-03-23 DIAGNOSIS — K5190 Ulcerative colitis, unspecified, without complications: Secondary | ICD-10-CM | POA: Diagnosis not present

## 2020-04-13 ENCOUNTER — Encounter: Payer: Self-pay | Admitting: Family

## 2020-04-13 ENCOUNTER — Ambulatory Visit: Payer: Medicare HMO | Attending: Internal Medicine | Admitting: Family

## 2020-04-13 ENCOUNTER — Other Ambulatory Visit: Payer: Self-pay

## 2020-04-13 VITALS — BP 126/74 | HR 67 | Temp 97.2°F | Resp 16 | Ht 73.0 in | Wt 204.8 lb

## 2020-04-13 DIAGNOSIS — Z0000 Encounter for general adult medical examination without abnormal findings: Secondary | ICD-10-CM | POA: Diagnosis not present

## 2020-04-13 DIAGNOSIS — Z23 Encounter for immunization: Secondary | ICD-10-CM

## 2020-04-13 NOTE — Progress Notes (Signed)
Subjective:   Raymond Franco is a 69 y.o. male who presents for an Initial Medicare Annual Wellness Visit. History of varicose vein of leg, ulcerative colitis with rectal bleeding, bunion, corn of toe, chronic kidney disease stage 2 GFR 60 - 89 ml/min, hyperlipidemia, acute pain of both knees, subcutaneous nodules, and over weight.  Review of Systems     Objective:    Today's Vitals   04/13/20 1035  BP: 126/74  Pulse: 67  Resp: 16  Temp: (!) 97.2 F (36.2 C)  SpO2: 98%  Weight: 204 lb 12.8 oz (92.9 kg)  Height: 6\' 1"  (1.854 m)   Body mass index is 27.02 kg/m.  Advanced Directives 04/13/2020 04/13/2017 03/29/2017 10/20/2016 01/10/2016 09/30/2015 02/23/2014  Does Patient Have a Medical Advance Directive? No No No No No No Patient does not have advance directive;Patient would not like information  Would patient like information on creating a medical advance directive? No - Patient declined No - Patient declined - No - Patient declined - - -   Patient reports he does not have a Medical Advanced Directive. Reports he would like information and will let his primary physician know we he is ready to proceed with this.   Current Medications (verified) Outpatient Encounter Medications as of 04/13/2020  Medication Sig  . atorvastatin (LIPITOR) 10 MG tablet Take 1 tablet (10 mg total) by mouth daily.  . Elastic Bandages & Supports (MEDICAL COMPRESSION STOCKINGS) MISC 30 mm Hg pressure (Patient not taking: Reported on 11/18/2018)  . Multiple Vitamin (MULTIVITAMIN) tablet Take 1 tablet by mouth daily.   No facility-administered encounter medications on file as of 04/13/2020.   Allergies (verified) Patient has no known allergies.    History: Past Medical History:  Diagnosis Date  . Lipoma    Past Surgical History:  Procedure Laterality Date  . LIPOMA EXCISION N/A 02/23/2014   Procedure: EXCISION LIPOMA ON BACK;  Surgeon: Theodoro Kos, DO;  Location: [place];  Service:  Plastics;  Laterality: N/A;  . NASAL FRACTURE SURGERY  2013   No family history on file. Social History   Socioeconomic History  . Marital status: Single    Spouse name: Not on file  . Number of children: Not on file  . Years of education: Not on file  . Highest education level: Not on file  Occupational History  . Not on file  Tobacco Use  . Smoking status: Never Smoker  . Smokeless tobacco: Never Used  Substance and Sexual Activity  . Alcohol use: Yes    Comment: RARE  . Drug use: No  . Sexual activity: Not on file  Other Topics Concern  . Not on file  Social History Narrative  . Not on file   Social Determinants of Health   Financial Resource Strain:   . Difficulty of Paying Living Expenses:   Food Insecurity:   . Worried About Charity fundraiser in the Last Year:   . Arboriculturist in the Last Year:   Transportation Needs:   . Film/video editor (Medical):   Marland Kitchen Lack of Transportation (Non-Medical):   Physical Activity:   . Days of Exercise per Week:   . Minutes of Exercise per Session:   Stress:   . Feeling of Stress :   Social Connections:   . Frequency of Communication with Friends and Family:   . Frequency of Social Gatherings with Friends and Family:   . Attends Religious Services:   . Active  Member of Clubs or Organizations:   . Attends Archivist Meetings:   Marland Kitchen Marital Status:     Tobacco Counseling Patient denies currently smoking. Reports he smoked 1/2 a cigarette for about 2 months when he was in the 2nd or 3rd grade. Reports currently he smokes socially on occasion but this is very infrequently.  Clinical Intake:  Pain : No/denies pain  Diabetes: No  Diabetic? No  Activities of Daily Living In your present state of health, do you have any difficulty performing the following activities: 04/13/2020  Hearing? N  Vision? N  Difficulty concentrating or making decisions? N  Walking or climbing stairs? N  Dressing or bathing? N   Doing errands, shopping? N  Preparing Food and eating ? N  Using the Toilet? N  In the past six months, have you accidently leaked urine? N  Do you have problems with loss of bowel control? N  Managing your Medications? N  Managing your Finances? N  Housekeeping or managing your Housekeeping? N  Some recent data might be hidden   Patient Care Team: Ladell Pier, MD as PCP - General (Internal Medicine)  Dr. Benson Norway- (Gastroenterologist), last visit June 2021   Indicate any recent Medical Services you may have received from other than [facility] providers in the past year (date may be approximate). None  Assessment:   This is a routine wellness examination for [facility].  Hearing/Vision screen  Hearing Screening   125Hz  250Hz  500Hz  1000Hz  2000Hz  3000Hz  4000Hz  6000Hz  8000Hz   Right ear:           Left ear:             Visual Acuity Screening   Right eye Left eye Both eyes  Without correction: 20 25 20 25 20 20   With correction:     Reports he does not recall last eye exam. Reports sometimes he wears reading glasses purchased over-the-counter. Denies prescription glasses and contact use. Requests eye exam referral. Whisper test is normal bilateral.  Dietary issues and exercise activities discussed: Reports his diet consist primarily of fruit, vegetables, steak, and salmon. Reports he does not eat many fried foods. Reports eating pies and cakes sometimes. Reports drinking water, not many sodas, and seldom drinks alcohol.  Goals: Make healthier diet choices to improve cholesterol management. Reports sometimes he doesn't take cholesterol medication because he doesn't like taking medication.  Depression Screen PHQ 2/9 Scores 04/13/2020 02/24/2020 05/12/2019 01/22/2018 04/13/2017 03/29/2017 10/20/2016  PHQ - 2 Score 0 0 0 0 0 0 0  PHQ- 9 Score - - 0 0 0 0 -    Fall Risk Fall Risk  04/13/2020 05/12/2019 01/22/2018 03/29/2017 09/30/2015  Falls in the past year? 0 0 No No No  Number falls in past yr: - 0  - - -  Injury with Fall? - 0 - - -   Any stairs in or around the home? Yes  from upstairs to the basement If so, are there any without handrails? No Home free of loose throw rugs in walkways, pet beds, electrical cords, etc? Yes Adequate lighting in your home to reduce risk of falls? Yes   ASSISTIVE DEVICES UTILIZED TO PREVENT FALLS: Life alert? No , not interested  Use of a cane, walker or w/c? No  Grab bars in the bathroom? Yes  on the bathtub Shower chair or bench in shower? No , denies Elevated toilet seat or a handicapped toilet? No   TIMED UP AND GO: Was the test  performed? Yes .  Length of time to ambulate 10 feet: 5 sec.   Gait steady and fast without use of assistive device  Cognitive Function: MMSE - Mini Mental State Exam 04/13/2020  Orientation to time 5  Orientation to Place 5  Registration 3  Attention/ Calculation 5  Recall 3  Language- name 2 objects 2  Language- repeat 1  Language- follow 3 step command 3  Language- read & follow direction 1  Write a sentence 1  Copy design 1  Total score 30    Immunizations Immunization History  Administered Date(s) Administered  . Moderna SARS-COVID-2 Vaccination 11/02/2019, 11/30/2019  . Pneumococcal Polysaccharide-23 10/20/2016  . Tdap 10/20/2016   TDAP status: Up to date Flu Vaccine status: Up to date Pneumococcal vaccine status: Declined,  Education has been provided regarding the importance of this vaccine but patient still declined. Advised may receive this vaccine at local pharmacy or Health Dept. Aware to provide a copy of the vaccination record if obtained from local pharmacy or Health Dept. Verbalized acceptance and understanding.  Patient declines Prevnar 13 vaccine today reports he will let his primary physician know when he is ready to proceed with this. Last received Pneumovax 23 on 10/20/2016. Covid-19 vaccine status: Completed vaccines  Qualifies for Shingles Vaccine? Yes   Zostavax completed No    Shingrix Completed?: No.    Education has been provided regarding the importance of this vaccine. Patient has been advised to call insurance company to determine out of pocket expense if they have not yet received this vaccine. Advised may also receive vaccine at local pharmacy or Health Dept. Verbalized acceptance and understanding.  Reports he will let his primary physician know when he is ready to proceed with this.  Screening Tests Health Maintenance  Topic Date Due  . PNA vac Low Risk Adult (2 of 2 - PCV13) 10/20/2017  . INFLUENZA VACCINE  05/09/2020  . COLONOSCOPY  01/23/2026  . TETANUS/TDAP  10/20/2026  . COVID-19 Vaccine  Completed  . Hepatitis C Screening  Completed   Health Maintenance  Health Maintenance Due  Topic Date Due  . PNA vac Low Risk Adult (2 of 2 - PCV13) 10/20/2017    Colorectal cancer screening: Completed 2017. Repeat every 10 years  Lung Cancer Screening: (Low Dose CT Chest recommended if Age 13-80 years, 30 pack-year currently smoking OR have quit w/in 15years.) does not qualify.   Lung Cancer Screening Referral:  Not applicable  Additional Screening:  Hepatitis C Screening: does not qualify; Completed 09/30/2015  Vision Screening: Recommended annual ophthalmology exams for early detection of glaucoma and other disorders of the eye. Is the patient up to date with their annual eye exam?  No  Who is the provider or what is the name of the office in which the patient attends annual eye exams? Not established with a provider If pt is not established with a provider, would they like to be referred to a provider to establish care? Yes .   Dental Screening: Recommended annual dental exams for proper oral hygiene Patient reports last dental screening was in 2019. Reports he does have a partial denture. Reports he will check with his preferred dental office to see if his primary dentist is still there. Reports he will let his primary physician know if he needs a  dental referral in the future.  Community Resource Referral / Chronic Care Management: CRR required this visit?  No   CCM required this visit?  No  Plan:  1. Encounter for Medicare annual wellness exam: -Patient declines Shingrix vaccine and reports he will reconsider at a later time.  -Patient declines Prevnar-13 vaccine and reports he will reconsider at a later time. -I had a detailed discussion with him about advanced directives. He will consider executing one after discussion with his family. I have given him an information packet that also includes documents to allow him to execute an advanced directive in the form of a living will or healthcare power of attorney.  2. Need for shingles vaccine: -See #1 above  I have personally reviewed and noted the following in the patient's chart:   . Medical and social history . Use of alcohol, tobacco or illicit drugs  . Current medications and supplements . Functional ability and status . Nutritional status . Physical activity . Advanced directives . List of other physicians . Hospitalizations, surgeries, and ER visits in previous 12 months . Vitals . Screenings to include cognitive, depression, and falls . Referrals and appointments  In addition, I have reviewed and discussed with patient certain preventive protocols, quality metrics, and best practice recommendations. A written personalized care plan for preventive services as well as general preventive health recommendations were provided to patient.     Camillia Herter, NP   04/13/2020

## 2020-04-13 NOTE — Patient Instructions (Addendum)
  Raymond Franco , Thank you for taking time to come for your Medicare Wellness Visit. I appreciate your ongoing commitment to your health goals. Please review the following plan we discussed and let me know if I can assist you in the future.    These are the goals we discussed: Make healthier diet choices to improve cholesterol management.  This is a list of the screening recommended for you and due dates:  Health Maintenance  Topic Date Due  . Pneumonia vaccines (2 of 2 - PCV13) 10/20/2017  . Flu Shot  05/09/2020  . Colon Cancer Screening  01/23/2026  . Tetanus Vaccine  10/20/2026  . COVID-19 Vaccine  Completed  .  Hepatitis C: One time screening is recommended by Center for Disease Control  (CDC) for  adults born from [birth year] through 1965.   Completed   Please let your primary care provider know when you are ready to take the Shingrix vaccine and the Prevnar 13 vaccine. A referral for an eye exam will requested on today. Follow-up with primary provider for management of chronic conditions in November 2021 or sooner if needed.

## 2020-05-10 DIAGNOSIS — K5190 Ulcerative colitis, unspecified, without complications: Secondary | ICD-10-CM | POA: Diagnosis not present

## 2020-06-25 ENCOUNTER — Telehealth: Payer: Self-pay | Admitting: Internal Medicine

## 2020-06-25 NOTE — Telephone Encounter (Signed)
Will forward to [facility]

## 2020-06-25 NOTE — Telephone Encounter (Signed)
Spoke w/ pt and he stated that he figured out what letter was, came from his insurance company, no further questions or needs

## 2020-06-25 NOTE — Telephone Encounter (Signed)
Please advise.   Copied from [place] [phone]. Topic: Quick Communication - See Telephone Encounter >> Jun 24, 2020  4:45 PM Loma [place] wrote: CRM for notification. See Telephone encounter for: 06/24/20.pt has called wanted a CB re: information "letter" he received and wants a CB. Refused to disclosure medical or what pertains to. States that we know, not seeing in history. FU [id number] genric

## 2021-03-31 DIAGNOSIS — R195 Other fecal abnormalities: Secondary | ICD-10-CM | POA: Diagnosis not present

## 2021-03-31 DIAGNOSIS — K5190 Ulcerative colitis, unspecified, without complications: Secondary | ICD-10-CM | POA: Diagnosis not present

## 2021-03-31 DIAGNOSIS — R197 Diarrhea, unspecified: Secondary | ICD-10-CM | POA: Diagnosis not present

## 2021-04-06 ENCOUNTER — Encounter: Payer: Self-pay | Admitting: Internal Medicine

## 2021-04-06 NOTE — Progress Notes (Signed)
I received a note from gastroenterologist Dr. Veverly Fells.  Patient was seen 03/31/2021.  Diagnosis was ulcerative colitis with guaiac positive stools. He wants the patient to continue mesalamine delayed release tablet 1.2 g, 4 tablets orally once a day.  12 refills given.  Blood tests ordered including CBC metabolic panel and C. difficile.  However results of these tests were not included in the note.

## 2021-05-18 DIAGNOSIS — R197 Diarrhea, unspecified: Secondary | ICD-10-CM | POA: Diagnosis not present

## 2021-05-18 DIAGNOSIS — K5190 Ulcerative colitis, unspecified, without complications: Secondary | ICD-10-CM | POA: Diagnosis not present

## 2021-10-12 DIAGNOSIS — K5190 Ulcerative colitis, unspecified, without complications: Secondary | ICD-10-CM | POA: Diagnosis not present

## 2021-10-19 ENCOUNTER — Encounter: Payer: Self-pay | Admitting: Internal Medicine

## 2021-10-19 NOTE — Progress Notes (Signed)
Note received from patient's gastroenterologist Dr. Benson Norway.  He was seen 10/12/2021.  The patient is currently on prednisone 40 mg daily for ulcerative colitis.  Plan is to switch him to Humira.

## 2021-11-14 DIAGNOSIS — K5190 Ulcerative colitis, unspecified, without complications: Secondary | ICD-10-CM | POA: Diagnosis not present

## 2021-12-15 ENCOUNTER — Ambulatory Visit (INDEPENDENT_AMBULATORY_CARE_PROVIDER_SITE_OTHER): Payer: Medicare HMO

## 2021-12-15 ENCOUNTER — Other Ambulatory Visit: Payer: Self-pay

## 2021-12-15 ENCOUNTER — Encounter: Payer: Self-pay | Admitting: Podiatry

## 2021-12-15 ENCOUNTER — Ambulatory Visit (INDEPENDENT_AMBULATORY_CARE_PROVIDER_SITE_OTHER): Payer: Medicare HMO | Admitting: Podiatry

## 2021-12-15 DIAGNOSIS — M2041 Other hammer toe(s) (acquired), right foot: Secondary | ICD-10-CM | POA: Diagnosis not present

## 2021-12-15 DIAGNOSIS — S9031XA Contusion of right foot, initial encounter: Secondary | ICD-10-CM

## 2021-12-15 NOTE — Progress Notes (Signed)
G

## 2021-12-18 NOTE — Progress Notes (Signed)
Subjective:  ? ?Patient ID: Raymond Franco, male   DOB: 71 y.o.   MRN: [id number]  ? ?HPI ?Patient presents stating that he has a very painful corn on the second toe that he has been dealing with for years.  He is tried to trim it and it continues to recur and it is sore.  Patient does not smoke likes to be active presents with caregiver ? ? ?Review of Systems  ?All other systems reviewed and are negative. ? ? ?   ?Objective:  ?Physical Exam ?Vitals and nursing note reviewed.  ?Constitutional:   ?   Appearance: He is well-developed.  ?Pulmonary:  ?   Effort: Pulmonary effort is normal.  ?Musculoskeletal:     ?   General: Normal range of motion.  ?Skin: ?   General: Skin is warm.  ?Neurological:  ?   Mental Status: He is alert.  ?  ?Neurovascular status intact muscle strength adequate range of motion adequate with a rigid contracture digit to right with dorsal keratotic lesion thick and painful when pressed.  Does make it hard to wear shoe gear comfortably and is gradually becoming more of an issue and patient did have good digital perfusion well oriented x3 ? ?   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 ?   ?Plan:  ?H&P reviewed conditions and at this point I do think digital fusion will be best for him given the long-term nature of condition and I educated him on digital fusion.  He would like to do this but he is going to hold off currently and we did deep debridement of lesion I applied padding to the area I advised on mesh type shoes and patient will be seen back in 6 weeks to discuss in better detail ? ?X-rays indicate rigid contracture digit to right with no other pathology surrounding the area no arthritis ?   ? ? ?

## 2022-01-03 DIAGNOSIS — H40003 Preglaucoma, unspecified, bilateral: Secondary | ICD-10-CM | POA: Diagnosis not present

## 2022-01-10 ENCOUNTER — Encounter: Payer: Self-pay | Admitting: Podiatry

## 2022-01-10 DIAGNOSIS — H401121 Primary open-angle glaucoma, left eye, mild stage: Secondary | ICD-10-CM | POA: Diagnosis not present

## 2022-01-12 ENCOUNTER — Ambulatory Visit: Payer: Medicaid Other | Admitting: Podiatry

## 2022-01-18 ENCOUNTER — Ambulatory Visit: Payer: Medicare HMO | Admitting: Podiatry

## 2022-01-18 DIAGNOSIS — R195 Other fecal abnormalities: Secondary | ICD-10-CM | POA: Diagnosis not present

## 2022-01-18 DIAGNOSIS — R197 Diarrhea, unspecified: Secondary | ICD-10-CM | POA: Diagnosis not present

## 2022-01-18 DIAGNOSIS — K5190 Ulcerative colitis, unspecified, without complications: Secondary | ICD-10-CM | POA: Diagnosis not present

## 2022-01-19 ENCOUNTER — Encounter: Payer: Self-pay | Admitting: Podiatry

## 2022-01-19 ENCOUNTER — Ambulatory Visit (INDEPENDENT_AMBULATORY_CARE_PROVIDER_SITE_OTHER): Payer: Medicare HMO | Admitting: Podiatry

## 2022-01-19 DIAGNOSIS — M2041 Other hammer toe(s) (acquired), right foot: Secondary | ICD-10-CM

## 2022-01-20 ENCOUNTER — Telehealth: Payer: Self-pay | Admitting: Urology

## 2022-01-20 NOTE — Progress Notes (Signed)
Subjective:  ? ?Patient ID: Raymond Franco, male   DOB: 71 y.o.   MRN: [id number]  ? ?HPI ?Patient states that it is somewhat improved from the tremor but it is coming back and I want to get it fixed as it felt much better for the first couple weeks neuro ? ? ?ROS ? ? ?   ?Objective:  ?Physical Exam  ?Vascular status intact with patient found to have rigid contracture digit to right dorsal keratotic lesion pain with the joint and inflammation with no breakdown of tissue or other pathology good digital perfusion well oriented x3 ? ?   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 ?   ?Plan:  ?H&P reviewed condition and I do think digital fusion would be best.  Patient wants surgery and I explained procedure risk and allowed him and his wife to read consent form understanding all possible complications and after review signed consent form.  Understands he will appended to approximately 5 weeks and scheduled for outpatient surgery ?   ? ? ?

## 2022-01-20 NOTE — Telephone Encounter (Signed)
DOS - 02/07/22 ? ?HAMMERTOE REPAIR 2ND RIGHT --- [id number] ? ?HUMANA EFFECTIVE DATE - 11/10/19 ? ?PER [employer] WEBSITE FOR CPT CODE 56979 HAS BEEN APPROVED, AUTH # [id number], GOOD FROM 02/07/22 - 05/08/22.  ? ?TRACKING #[id number] ? ? ?

## 2022-01-31 ENCOUNTER — Encounter: Payer: Self-pay | Admitting: Podiatry

## 2022-02-07 ENCOUNTER — Other Ambulatory Visit: Payer: Self-pay | Admitting: Podiatry

## 2022-02-07 ENCOUNTER — Telehealth: Payer: Self-pay | Admitting: *Deleted

## 2022-02-07 DIAGNOSIS — M2042 Other hammer toe(s) (acquired), left foot: Secondary | ICD-10-CM | POA: Diagnosis not present

## 2022-02-07 DIAGNOSIS — M2041 Other hammer toe(s) (acquired), right foot: Secondary | ICD-10-CM | POA: Diagnosis not present

## 2022-02-07 MED ORDER — HYDROCODONE-ACETAMINOPHEN 5-325 MG PO TABS: ORAL_TABLET | ORAL | 0 refills | Status: DC

## 2022-02-07 NOTE — Telephone Encounter (Signed)
Notified patient that the prescription is ready for pick up at pharmacy on file. ?

## 2022-02-07 NOTE — Telephone Encounter (Signed)
Patient's wife is calling because husband's pharmacy will not accept a handwritten prescription, only electronic. Please send to [facility] on file.  ?

## 2022-02-07 NOTE — Progress Notes (Signed)
Rx for pain medication sent to [facility], had sx this morning and they would not accept written Rx ?

## 2022-02-13 ENCOUNTER — Ambulatory Visit (INDEPENDENT_AMBULATORY_CARE_PROVIDER_SITE_OTHER): Payer: Medicare HMO | Admitting: Podiatry

## 2022-02-13 ENCOUNTER — Ambulatory Visit (INDEPENDENT_AMBULATORY_CARE_PROVIDER_SITE_OTHER): Payer: Medicare HMO

## 2022-02-13 ENCOUNTER — Encounter: Payer: Self-pay | Admitting: Podiatry

## 2022-02-13 DIAGNOSIS — Z9889 Other specified postprocedural states: Secondary | ICD-10-CM

## 2022-02-13 NOTE — Progress Notes (Signed)
Subjective:  ? ?Patient ID: Raymond Franco, male   DOB: 71 y.o.   MRN: [id number]  ? ?HPI ?Patient states doing really well with surgery minimal pain ? ? ?ROS ? ? ?   ?Objective:  ?Physical Exam  ?Neurovascular status intact negative Bevelyn Buckles' sign noted wound edges well coapted stitches intact pin has moved out slightly distal and may have been bumped ? ?   ?Assessment:  ?Overall doing well with pin which has moved out slightly distal but clinically the toe is in excellent position ? ?   ?Plan:  ?H&P reviewed x-rays applied sterile dressing continue to hold the toe down hopefully we can maintain the pin for at least another several weeks but if it has to come out we will just use bandaging technique.  Reappoint 2 weeks suture removal earlier if needed and hopefully 4 weeks for pin removal ? ?X-ray indicates the pin has moved out somewhat he had a very significant deformity preoperatively I do think the position it is and clinically will be very satisfactory for him but we may use bandaging depending on how the pin does ?   ? ? ?

## 2022-02-21 ENCOUNTER — Encounter: Payer: Self-pay | Admitting: Internal Medicine

## 2022-02-21 ENCOUNTER — Ambulatory Visit: Payer: Medicare HMO | Attending: Internal Medicine | Admitting: Internal Medicine

## 2022-02-21 VITALS — BP 113/74 | HR 66 | Temp 98.4°F | Resp 16 | Ht 72.0 in | Wt 201.0 lb

## 2022-02-21 DIAGNOSIS — N1831 Chronic kidney disease, stage 3a: Secondary | ICD-10-CM | POA: Diagnosis not present

## 2022-02-21 DIAGNOSIS — Z8719 Personal history of other diseases of the digestive system: Secondary | ICD-10-CM

## 2022-02-21 DIAGNOSIS — Z23 Encounter for immunization: Secondary | ICD-10-CM

## 2022-02-21 DIAGNOSIS — E782 Mixed hyperlipidemia: Secondary | ICD-10-CM | POA: Diagnosis not present

## 2022-02-21 DIAGNOSIS — Z0000 Encounter for general adult medical examination without abnormal findings: Secondary | ICD-10-CM

## 2022-02-21 DIAGNOSIS — Z0001 Encounter for general adult medical examination with abnormal findings: Secondary | ICD-10-CM

## 2022-02-21 NOTE — Patient Instructions (Signed)
Please have your eye doctor send me a report after you have had your eye exam done. ?Please get established with a dentist for routine dental screening twice a year. ?Please return in 2 weeks to have your pneumonia vaccine. ?We will give you your second Shingrix vaccine on your follow-up visit in 6 months. ?

## 2022-02-21 NOTE — Progress Notes (Signed)
? ?Subjective:  ? Raymond Franco is a 71 y.o. male who presents for Medicare Annual/Subsequent preventive examination and chronic disease management. 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 ?Review of Systems    ?GEN: BMI is 27 showing him to be slightly overweight for height.  Patient reports he is doing well with his eating habits.  Does not drink sugary drinks like sodas juices or sweet tea.  He is consistent with getting in fruits and vegetables daily.  Eats adequate portion sizes.  He is active.  He rides his bike or uses his elliptical 5 days a week. ?GI:  seeing Dr. Ronalee Red for UC.  Was on Prednisone but not anymore.  Pt declined Humira.  Not on anything at this time.  No recent flares.  Moving his bowels okay.  Has c-scopoed scheduled for 03/27/22 ?CV: He has history of hyperlipidemia.  I had prescribed atorvastatin for him.  He tells me that he took it briefly and then stopped taking it because he does not like taking medications. ?GU: Has history of CKD.  He is not on any NSAIDs.  Passing urine freely without problems. ?  ?MSK: Had recent surgery for hammertoe on the left second toe.  Done by podiatrist Dr. Paulla Dolly.  Healing well.  Wearing Velcro surgical shoe.  Has follow-up appointment within the next 1 to 2 weeks. ?   ?Objective:  ?  ?Today's Vitals  ? 02/21/22 1003  ?BP: 113/74  ?Pulse: 66  ?Resp: 16  ?Temp: 98.4 ?F (36.9 ?C)  ?TempSrc: Oral  ?SpO2: 99%  ?Weight: 201 lb (91.2 kg)  ?Height: 6' (1.829 m)  ?PainSc: 0-No pain  ? ?Body mass index is 27.26 kg/m?. ?Wt Readings from Last 3 Encounters:  ?02/21/22 201 lb (91.2 kg)  ?04/13/20 204 lb 12.8 oz (92.9 kg)  ?02/24/20 193 lb 12.8 oz (87.9 kg)  ? ? ? ?  02/21/2022  ? 10:12 AM 04/13/2020  ? 10:40 AM 04/13/2017  ? 10:55 AM 03/29/2017  ? 11:03 AM 10/20/2016  ? 11:07 AM 01/10/2016  ? 11:04 AM 09/30/2015  ? 10:30 AM  ?Advanced Directives  ?Does Patient Have a Medical Advance Directive? No No No No No No No  ?Would patient like  information on creating a medical advance directive? No - Patient declined No - Patient declined No - Patient declined  No - Patient declined    ? ? ?Current Medications (verified) ?Outpatient Encounter Medications as of 02/21/2022  ?Medication Sig  ? Multiple Vitamin (MULTIVITAMIN) tablet Take 1 tablet by mouth daily.  ? Elastic Bandages & Supports (MEDICAL COMPRESSION STOCKINGS) MISC 30 mm Hg pressure (Patient not taking: Reported on 11/18/2018)  ? [DISCONTINUED] atorvastatin (LIPITOR) 10 MG tablet Take 1 tablet (10 mg total) by mouth daily. (Patient not taking: Reported on 02/21/2022)  ? [DISCONTINUED] HYDROcodone-acetaminophen (NORCO/VICODIN) 5-325 MG tablet Take one to two tablets every six to eight hours as needed for pain. (Patient not taking: Reported on 02/21/2022)  ? [DISCONTINUED] mesalamine (LIALDA) 1.2 g EC tablet Take 4.8 g by mouth daily with breakfast. (Patient not taking: Reported on 02/21/2022)  ? ?No facility-administered encounter medications on file as of 02/21/2022.  ? ? ?Allergies (verified) ?Patient has no known allergies.  ? ?History: ?Past Medical History:  ?Diagnosis Date  ? Lipoma   ? ?Past Surgical History:  ?Procedure Laterality Date  ? LIPOMA EXCISION N/A 02/23/2014  ? Procedure: EXCISION LIPOMA ON BACK;  Surgeon: Theodoro Kos, DO;  Location: [facility];  Service: Plastics;  Laterality: N/A;  ? NASAL FRACTURE SURGERY  2013  ? ?No family history on file. ?Social History  ? ?Socioeconomic History  ? Marital status: Single  ?  Spouse name: Not on file  ? Number of children: Not on file  ? Years of education: Not on file  ? Highest education level: Not on file  ?Occupational History  ? Not on file  ?Tobacco Use  ? Smoking status: Never  ? Smokeless tobacco: Never  ?Substance and Sexual Activity  ? Alcohol use: Yes  ?  Comment: RARE  ? Drug use: No  ? Sexual activity: Not on file  ?Other Topics Concern  ? Not on file  ?Social History Narrative  ? Not on file  ? ?Social  Determinants of Health  ? ?Financial Resource Strain: Not on file  ?Food Insecurity: Not on file  ?Transportation Needs: Not on file  ?Physical Activity: Not on file  ?Stress: Not on file  ?Social Connections: Not on file  ? ? ?Tobacco Counseling ?Pt does not smoke.  Use to cigars occasionally ? ? ?Clinical Intake: ?Pain Score: 0-No pain ? ?Diabetic? no ? ?Activities of Daily Living ?   ? View : No data to display.  ?  ?  ?  ?No problems with carrying out his ADLs.  No problems driving.  He takes care of his bills.  No major issues making decisions. ? ?Patient Care Team: ?Ladell Pier, MD as PCP - General (Internal Medicine) ?Dr. Paulla Dolly - Podiatrist ?Dr. Ronalee Red -GI ?Indicate any recent Medical Services you may have received from other than [facility] providers in the past year (date may be approximate). ? ?   ?Assessment:  ? This is a routine wellness examination for [name]. ? ?Hearing/Vision screen ? ?Wears readers.  Reports he has an eye exam scheduled next week at an eye doctor located in the [facility]. ?Whisper test normal today. ? ? ?Dietary issues and exercise activities discussed: ?  ?Stays away from sugary drinks.  Drinks ETOH beverages very seldom ?Fruits and veggies daily ?Not too much red meat ? ? Goals Addressed   ?None ?  ?Depression Screen ? ?  04/13/2020  ? 10:40 AM 02/24/2020  ? 10:36 AM 05/12/2019  ?  4:14 PM 01/22/2018  ?  9:28 AM 04/13/2017  ? 10:58 AM 03/29/2017  ? 11:03 AM 10/20/2016  ? 11:08 AM  ?PHQ 2/9 Scores  ?PHQ - 2 Score 0 0 0 0 0 0 0  ?PHQ- 9 Score   0 0 0 0   ?  ?Fall Risk ? ?  02/21/2022  ? 10:04 AM 04/13/2020  ? 10:40 AM 05/12/2019  ?  4:15 PM 01/22/2018  ?  9:28 AM 03/29/2017  ? 11:03 AM  ?Fall Risk   ?Falls in the past year? 0 0 0 No No  ?Number falls in past yr: 0  0    ?Injury with Fall? 0  0    ?Risk for fall due to : No Fall Risks      ?Follow up Falls evaluation completed      ? ? ?FALL RISK PREVENTION PERTAINING TO THE HOME: ? ?Any stairs in or around the home? Yes  ?If so, are there any  without handrails? No  ?Home free of loose throw rugs in walkways, pet beds, electrical cords, etc? Yes  ?Adequate lighting in your home to reduce risk of falls? Yes  ? ?ASSISTIVE DEVICES UTILIZED TO PREVENT FALLS: ? ?  Life alert? No  ?Use of a cane, walker or w/c? No  ?Grab bars in the bathroom? No  ?Shower chair or bench in shower? No  ?Elevated toilet seat or a handicapped toilet? No  ?Patient does not feel he needs any of the above at this time. ? ?TIMED UP AND GO: ? ?Was the test performed? Yes .  ?Length of time to ambulate 10 feet: 8 sec.  ? ?Gait steady and fast without use of assistive device ? ?Cognitive Function: ? ?  02/21/2022  ? 10:10 AM 04/13/2020  ? 10:41 AM  ?MMSE - Mini Mental State Exam  ?Orientation to time 5 5  ?Orientation to Place 5 5  ?Registration 3 3  ?Attention/ Calculation 5 5  ?Recall 3 3  ?Language- name 2 objects 2 2  ?Language- repeat 1 1  ?Language- follow 3 step command 3 3  ?Language- read & follow direction 1 1  ?Write a sentence 1 1  ?Copy design  1  ?Total score  30  ?Total score 29/30 ?  ? ?  02/21/2022  ? 10:06 AM  ?6CIT Screen  ?What Year? 0 points  ?What month? 0 points  ?What time? 0 points  ?Count back from 20 0 points  ?Months in reverse 0 points  ?Repeat phrase 8 points  ?Total Score 8 points  ? ? ?Immunizations ?Immunization History  ?Administered Date(s) Administered  ? Moderna Sars-Covid-2 Vaccination 11/02/2019, 11/30/2019  ? Pneumococcal Polysaccharide-23 10/20/2016  ? Tdap 10/20/2016  ? Zoster Recombinat (Shingrix) 02/21/2022  ? ? ?TDAP status: Up to date ? ?Flu Vaccine status: Up to date ? ?Pneumococcal vaccine status: Due, Education has been provided regarding the importance of this vaccine. Advised may receive this vaccine at local pharmacy or Health Dept. Aware to provide a copy of the vaccination record if obtained from local pharmacy or Health Dept. Verbalized acceptance and understanding. ?Patient scheduled to see our clinical pharmacist in 2 weeks to have PCV 20  vaccine.  He deferred on having it today because he excepted the first shot of the Shingrix. ? ?Covid-19 vaccine status: Information provided on how to obtain vaccines.  ? ?Qualifies for Shingles Vaccine? Yes   ?[facility]

## 2022-02-22 LAB — COMPREHENSIVE METABOLIC PANEL
ALT: 15 IU/L (ref 0–44)
AST: 21 IU/L (ref 0–40)
Albumin/Globulin Ratio: 1.4 (ref 1.2–2.2)
Albumin: 4.3 g/dL (ref 3.8–4.8)
Alkaline Phosphatase: 52 IU/L (ref 44–121)
BUN/Creatinine Ratio: 16 (ref 10–24)
BUN: 20 mg/dL (ref 8–27)
Bilirubin Total: 1.1 mg/dL (ref 0.0–1.2)
CO2: 22 mmol/L (ref 20–29)
Calcium: 10 mg/dL (ref 8.6–10.2)
Chloride: 105 mmol/L (ref 96–106)
Creatinine, Ser: 1.22 mg/dL (ref 0.76–1.27)
Globulin, Total: 3.1 g/dL (ref 1.5–4.5)
Glucose: 85 mg/dL (ref 70–99)
Potassium: 4.7 mmol/L (ref 3.5–5.2)
Sodium: 141 mmol/L (ref 134–144)
Total Protein: 7.4 g/dL (ref 6.0–8.5)
eGFR: 64 mL/min/{1.73_m2} (ref >59)

## 2022-02-22 LAB — CBC
Hematocrit: 42 % (ref 37.5–51.0)
Hemoglobin: 14.6 g/dL (ref 13.0–17.7)
MCH: 29.9 pg (ref 26.6–33.0)
MCHC: 34.8 g/dL (ref 31.5–35.7)
MCV: 86 fL (ref 79–97)
Platelets: 230 10*3/uL (ref 150–450)
RBC: 4.88 x10E6/uL (ref 4.14–5.80)
RDW: 11.8 % (ref 11.6–15.4)
WBC: 5 10*3/uL (ref 3.4–10.8)

## 2022-02-22 LAB — LIPID PANEL
Chol/HDL Ratio: 4.8 ratio (ref 0.0–5.0)
Cholesterol, Total: 271 mg/dL — ABNORMAL HIGH (ref 100–199)
HDL: 57 mg/dL (ref >39)
LDL Chol Calc (NIH): 197 mg/dL — ABNORMAL HIGH (ref 0–99)
Triglycerides: 97 mg/dL (ref 0–149)
VLDL Cholesterol Cal: 17 mg/dL (ref 5–40)

## 2022-02-22 NOTE — Progress Notes (Signed)
Let patient know that his cholesterol levels continue to rise.  His bad cholesterol called the LDL cholesterol is 197 with goal being less than 100.  This increases his risk for heart attack and strokes.  I strongly recommend getting back on the cholesterol-lowering medication called atorvastatin which I had prescribed for him in the past.  Let me know if he is willing to restart the medication and I can send the prescription to the pharmacy of his choice. ?Blood cell counts, kidney and liver function tests are good.

## 2022-02-24 DIAGNOSIS — H2513 Age-related nuclear cataract, bilateral: Secondary | ICD-10-CM | POA: Diagnosis not present

## 2022-02-24 DIAGNOSIS — H401111 Primary open-angle glaucoma, right eye, mild stage: Secondary | ICD-10-CM | POA: Diagnosis not present

## 2022-03-01 ENCOUNTER — Ambulatory Visit: Payer: Medicare HMO | Admitting: Podiatry

## 2022-03-01 ENCOUNTER — Encounter: Payer: Self-pay | Admitting: Podiatry

## 2022-03-01 ENCOUNTER — Ambulatory Visit (INDEPENDENT_AMBULATORY_CARE_PROVIDER_SITE_OTHER): Payer: Medicare HMO

## 2022-03-01 ENCOUNTER — Ambulatory Visit (INDEPENDENT_AMBULATORY_CARE_PROVIDER_SITE_OTHER): Payer: Medicare HMO | Admitting: Podiatry

## 2022-03-01 DIAGNOSIS — Z9889 Other specified postprocedural states: Secondary | ICD-10-CM | POA: Diagnosis not present

## 2022-03-01 NOTE — Progress Notes (Signed)
Subjective:   Patient ID: Raymond Franco, male   DOB: 71 y.o.   MRN: [id number]   HPI Patient states toe is doing well and thinks the position of the pin in the second toe is stable   ROS      Objective:  Physical Exam  Neurovascular status intact stitches intact wound edges coapted well toe clinically is doing very well with good alignment noted     Assessment:  Digit that is healing well clinically with malalignment of the pin but I think it is holding it in the place that we wanted held     Plan:  H&P x-ray reviewed continue with same treatment stitches removed return in 2 weeks pin removal earlier if needed  X-rays indicate the pin is going dorsally and the shaft of the proximal phalanx but it is holding the joint together properly and clinically the toe position is excellent patient very satisfied

## 2022-03-15 ENCOUNTER — Ambulatory Visit (INDEPENDENT_AMBULATORY_CARE_PROVIDER_SITE_OTHER): Payer: Medicare HMO

## 2022-03-15 ENCOUNTER — Encounter: Payer: Self-pay | Admitting: Podiatry

## 2022-03-15 ENCOUNTER — Ambulatory Visit (INDEPENDENT_AMBULATORY_CARE_PROVIDER_SITE_OTHER): Payer: Medicare HMO | Admitting: Podiatry

## 2022-03-15 DIAGNOSIS — M79675 Pain in left toe(s): Secondary | ICD-10-CM

## 2022-03-15 DIAGNOSIS — M79674 Pain in right toe(s): Secondary | ICD-10-CM | POA: Diagnosis not present

## 2022-03-15 DIAGNOSIS — M2041 Other hammer toe(s) (acquired), right foot: Secondary | ICD-10-CM

## 2022-03-15 DIAGNOSIS — B351 Tinea unguium: Secondary | ICD-10-CM | POA: Diagnosis not present

## 2022-03-15 NOTE — Progress Notes (Signed)
Subjective:   Patient ID: Ardine Eng, male   DOB: 71 y.o.   MRN: [id number]   HPI Patient presents stating he is doing well with surgery the pin fell out yesterday but he is very pleased   ROS      Objective:  Physical Exam  Neurovascular status intact negative Bevelyn Buckles' sign noted second digit good alignment wound edges well coapted slightly elevated but overall excellent position no pain     Assessment:  Doing well post fusion digit to right with good clinical alignment slight elevation     Plan:  H&P final x-ray taken May return to normal shoe gear at the current time reappoint as needed  X-rays indicate this toe is in good alignment slightly elevated but it should heal uneventfully at this position clinically looks great

## 2022-03-27 ENCOUNTER — Ambulatory Visit: Payer: Medicare HMO | Admitting: Pharmacist

## 2022-04-27 DIAGNOSIS — K5150 Left sided colitis without complications: Secondary | ICD-10-CM | POA: Diagnosis not present

## 2022-04-27 DIAGNOSIS — K5100 Ulcerative (chronic) pancolitis without complications: Secondary | ICD-10-CM | POA: Diagnosis not present

## 2022-04-27 DIAGNOSIS — K529 Noninfective gastroenteritis and colitis, unspecified: Secondary | ICD-10-CM | POA: Diagnosis not present

## 2022-05-03 ENCOUNTER — Encounter: Payer: Self-pay | Admitting: Internal Medicine

## 2022-05-03 DIAGNOSIS — H401111 Primary open-angle glaucoma, right eye, mild stage: Secondary | ICD-10-CM | POA: Diagnosis not present

## 2022-05-03 DIAGNOSIS — H2513 Age-related nuclear cataract, bilateral: Secondary | ICD-10-CM | POA: Diagnosis not present

## 2022-05-03 NOTE — Progress Notes (Signed)
I received pathology report from [facility] of colonoscopy that was done 04/27/2022 by Dr. Carol Ada. Biopsy from transverse colon revealed unremarkable colonic mucosa with no dysplasia or malignancy. Left-sided colon biopsy revealed chronic active colitis compatible with clinical history of ulcerative colitis.  No dysplasia or malignancy identified.

## 2022-07-31 DIAGNOSIS — K5190 Ulcerative colitis, unspecified, without complications: Secondary | ICD-10-CM | POA: Diagnosis not present

## 2022-08-02 DIAGNOSIS — H2513 Age-related nuclear cataract, bilateral: Secondary | ICD-10-CM | POA: Diagnosis not present

## 2022-08-02 DIAGNOSIS — H401111 Primary open-angle glaucoma, right eye, mild stage: Secondary | ICD-10-CM | POA: Diagnosis not present

## 2022-08-24 ENCOUNTER — Ambulatory Visit: Payer: Medicare HMO | Admitting: Internal Medicine

## 2022-09-29 ENCOUNTER — Ambulatory Visit: Payer: Medicare HMO | Admitting: Internal Medicine

## 2022-10-05 ENCOUNTER — Ambulatory Visit: Payer: Medicare HMO | Admitting: Physician Assistant

## 2022-10-14 DIAGNOSIS — K5190 Ulcerative colitis, unspecified, without complications: Secondary | ICD-10-CM | POA: Diagnosis not present

## 2022-10-18 DIAGNOSIS — H401111 Primary open-angle glaucoma, right eye, mild stage: Secondary | ICD-10-CM | POA: Diagnosis not present

## 2022-11-06 ENCOUNTER — Ambulatory Visit: Payer: Self-pay | Admitting: *Deleted

## 2022-11-06 NOTE — Telephone Encounter (Signed)
     Summary: skin tare   Pt called saying he was on a flight and noticed his ankle was swollen.  He said it was itching and he scratched it and the skin is torn a little.  I made him an apt for Thursday but afraid it may get infected before then Please advise [phone]         Chief Complaint: skin tears Symptoms: left foot and ankle swelling noted after flying. Leg itching and scratched and noted skin tear. Skin tear smaller than finger in size. No bleeding . Outer and inner ankle areas and bilateral rib area. Skin reddish in color no drainage.  Frequency: 10/31/22 Tuesday  Pertinent Negatives: Patient denies fever, no bleeding no pain  no large amount of redness around skin tears. Disposition: '[]'$ ED /'[]'$ Urgent Care (no appt availability in office) / '[x]'$ Appointment(In office/virtual)/ '[]'$  [place] [facility]/ '[]'$ Home Care/ '[]'$ Refused Recommended Disposition /'[]'$ [facility]/ '[]'$  Follow-up with PCP Additional Notes:   Appt scheduled for 11/09/22. Please advise if patient needs to be seen earlier.      Reason for Disposition  [1] After 14 days AND [2] wound isn't healed    After 7 days not healed  Answer Assessment - Initial Assessment Questions 1. APPEARANCE of INJURY: "What does the injury look like?"      *No Answer* 2. SIZE: "How large is the cut?"      *No Answer* 3. BLEEDING: "Is it bleeding now?" If Yes, ask: "Is it difficult to stop?"      *No Answer* 4. LOCATION: "Where is the injury located?"      *No Answer* 5. ONSET: "How long ago did the injury occur?"      *No Answer* 6. MECHANISM: "Tell me how it happened."      *No Answer* 7. TETANUS: "When was the last tetanus booster?"     *No Answer* 8. PREGNANCY: "Is there any chance you are pregnant?" "When was your last menstrual period?"     *No Answer*  Answer Assessment - Initial Assessment Questions 1. DESCRIPTION: "What does the injury look like?"  "Is there any part of the skin missing?"  If yes, "How  much of the skin flap is missing?"  (25%, 50%, 75%, all)     Skin tear to outter and inner part of left ankle and reports minor areas to rib area 2. SIZE: "How large is the skin tear?"     Less than size of finger 3. BLEEDING: "Is it bleeding now?" If Yes, ask: "Is it difficult to stop?"     no 4. LOCATION: "Where is the skin tear located?"     Left ankle outer and inner area , bilateral rib areas 5. ONSET: "How long ago did the skin tear occur?"     Last Tuesday 10/31/22 6. MECHANISM: "Tell me how it happened."     On a flight and noted ankle and foot swelling and itching. Scratched areas and skin tore. Reports small amount redness noted  Protocols used: Cuts and Lacerations-A-AH, Skin Tear-A-AH

## 2022-11-09 ENCOUNTER — Ambulatory Visit: Payer: Medicare HMO | Attending: Internal Medicine | Admitting: Internal Medicine

## 2022-11-09 ENCOUNTER — Encounter: Payer: Self-pay | Admitting: Internal Medicine

## 2022-11-09 VITALS — BP 96/62 | HR 97 | Temp 98.2°F

## 2022-11-09 DIAGNOSIS — Z23 Encounter for immunization: Secondary | ICD-10-CM | POA: Diagnosis not present

## 2022-11-09 DIAGNOSIS — L309 Dermatitis, unspecified: Secondary | ICD-10-CM | POA: Diagnosis not present

## 2022-11-09 DIAGNOSIS — N1831 Chronic kidney disease, stage 3a: Secondary | ICD-10-CM | POA: Diagnosis not present

## 2022-11-09 DIAGNOSIS — K5190 Ulcerative colitis, unspecified, without complications: Secondary | ICD-10-CM

## 2022-11-09 MED ORDER — PREDNISONE 20 MG PO TABS: ORAL_TABLET | ORAL | 0 refills | Status: DC

## 2022-11-09 MED ORDER — ZOSTER VAC RECOMB ADJUVANTED 50 MCG/0.5ML IM SUSR: 0.5000 mL | Freq: Once | INTRAMUSCULAR | 0 refills | Status: AC

## 2022-11-09 MED ORDER — TRIAMCINOLONE ACETONIDE 0.1 % EX CREA: 1.0000 | TOPICAL_CREAM | Freq: Two times a day (BID) | CUTANEOUS | 1 refills | Status: DC

## 2022-11-09 NOTE — Progress Notes (Signed)
Patient ID: Raymond Franco, male    DOB: [birth year]  MRN: [id number]  CC: Chronic disease management  Subjective: Raymond Franco is a 72 y.o. male who presents for chronic ds management His concerns today include:  Patient with history of HL, CKD stage II-III, varicose vein, ulcerative colitis   HM: Due for 2nd shingles vaccine.   Due for COVID booster.  Declines PCV  UC:  had c-scope 04/2022.  Biopsy from transverse colon revealed unremarkable colonic mucosa with no dysplasia or malignancy. Left-sided colon biopsy revealed chronic active colitis compatible with clinical history of ulcerative colitis. No dysplasia or malignancy identified.  Was on Humira but changed to Stelara 1.5wks ago by his GI.  A few days after taking it, he developed a rash over his trunk and extremities.  It is a little itchy.  Also noted some swelling in the left ankle.  He has not had any fever or joint aches/joint swelling.  He thinks it has been caused by the Stelara.  Not on any other new medications and no change in body products. Has appt with Dr. Ronalee Red on Tuesday  In regards to his kidney function, last BMP revealed GFR of 64.  Range has been in the 50s to 60s.  Patient Active Problem List   Diagnosis Date Noted   Stage 3a chronic kidney disease ([place]) 11/09/2022   Ulcerative colitis with rectal bleeding ([place]) 02/24/2020   Over weight 02/24/2020   Corn of toe 12/05/2018   CKD (chronic kidney disease) stage 2, GFR 60-89 ml/min 11/18/2018   Acute pain of both knees 03/29/2017   Subcutaneous nodules 03/29/2017   Varicose vein of leg 10/20/2016   Bunion 10/20/2016   Hyperlipidemia 10/01/2015     Current Outpatient Medications on File Prior to Visit  Medication Sig Dispense Refill   dorzolamide-timolol (COSOPT) 2-0.5 % ophthalmic solution Place 1 drop into the left eye 2 (two) times daily.     Elastic Bandages & Supports (MEDICAL COMPRESSION STOCKINGS) MISC 30 mm Hg pressure 2 each 0   HUMIRA  PEN-CD/UC/HS STARTER 80 MG/0.8ML PNKT Inject into the skin.     latanoprost (XALATAN) 0.005 % ophthalmic solution      Multiple Vitamin (MULTIVITAMIN) tablet Take 1 tablet by mouth daily.     STELARA 130 MG/26ML SOLN injection Inject into the vein.     No current facility-administered medications on file prior to visit.    No Known Allergies  Social History   Socioeconomic History   Marital status: Single    Spouse name: Not on file   Number of children: Not on file   Years of education: Not on file   Highest education level: Not on file  Occupational History   Not on file  Tobacco Use   Smoking status: Never   Smokeless tobacco: Never  Substance and Sexual Activity   Alcohol use: Yes    Comment: RARE   Drug use: No   Sexual activity: Not on file  Other Topics Concern   Not on file  Social History Narrative   Not on file   Social Determinants of Health   Financial Resource Strain: Not on file  Food Insecurity: Not on file  Transportation Needs: Not on file  Physical Activity: Not on file  Stress: Not on file  Social Connections: Not on file  Intimate Partner Violence: Not on file    No family history on file.  Past Surgical History:  Procedure Laterality Date   LIPOMA EXCISION N/A  02/23/2014   Procedure: EXCISION LIPOMA ON BACK;  Surgeon: Theodoro Kos, DO;  Location: [facility];  Service: Plastics;  Laterality: N/A;   NASAL FRACTURE SURGERY  2013    ROS: Review of Systems Negative except as stated above  PHYSICAL EXAM: BP 96/62   Pulse 97   Temp 98.2 F (36.8 C) (Oral)   SpO2 98%   Physical Exam   General appearance - alert, well appearing, and in no distress Mental status - normal mood, behavior, speech, dress, motor activity, and thought processes Chest - clear to auscultation, no wheezes, rales or rhonchi, symmetric air entry Heart - normal rate, regular rhythm, normal S1, S2, no murmurs, rubs, clicks or gallops Extremities -he  has some varicose veins in the legs.  Slight edema of the left ankle.  Good range of motion of the ankle. Skin -patient has eczema type rash scattered over the lower legs.  There are some red plaques noted also on the inner thigh and upper arms.  See pictures below           Latest Ref Rng & Units 02/21/2022   11:25 AM 02/24/2020   11:20 AM 11/18/2018    4:38 PM  CMP  Glucose 70 - 99 mg/dL 85  74  75   BUN 8 - 27 mg/dL '20  14  13   '$ Creatinine 0.76 - 1.27 mg/dL 1.22  1.50  1.47   Sodium 134 - 144 mmol/L 141  141  141   Potassium 3.5 - 5.2 mmol/L 4.7  4.0  4.4   Chloride 96 - 106 mmol/L 105  104  102   CO2 20 - 29 mmol/L '22  22  23   '$ Calcium 8.6 - 10.2 mg/dL 10.0  10.0  10.0   Total Protein 6.0 - 8.5 g/dL 7.4  7.5  7.4   Total Bilirubin 0.0 - 1.2 mg/dL 1.1  1.1  1.0   Alkaline Phos 44 - 121 IU/L 52  70  55   AST 0 - 40 IU/L '21  22  22   '$ ALT 0 - 44 IU/L '15  12  16    '$ Lipid Panel     Component Value Date/Time   CHOL 271 (H) 02/21/2022 1125   TRIG 97 02/21/2022 1125   HDL 57 02/21/2022 1125   CHOLHDL 4.8 02/21/2022 1125   CHOLHDL 4.5 10/20/2016 1146   VLDL 19 10/20/2016 1146   LDLCALC 197 (H) 02/21/2022 1125    CBC    Component Value Date/Time   WBC 5.0 02/21/2022 1125   WBC 4.8 09/30/2015 1117   RBC 4.88 02/21/2022 1125   RBC 4.87 09/30/2015 1117   HGB 14.6 02/21/2022 1125   HCT 42.0 02/21/2022 1125   PLT 230 02/21/2022 1125   MCV 86 02/21/2022 1125   MCH 29.9 02/21/2022 1125   MCH 29.4 09/30/2015 1117   MCHC 34.8 02/21/2022 1125   MCHC 33.4 09/30/2015 1117   RDW 11.8 02/21/2022 1125   LYMPHSABS 1.5 02/24/2020 1120   EOSABS 0.1 02/24/2020 1120   BASOSABS 0.0 02/24/2020 1120    ASSESSMENT AND PLAN:  1. Dermatitis Extensive dermatitis that started after first Stelara injection Patient to hold off on any further injections until he sees his gastroenterologist next Tuesday. In the meantime I will put him on some prednisone taper with triamcinolone cream. -  predniSONE (DELTASONE) 20 MG tablet; 2 tabs PO daily x 2 days then 1.5 tab PO x 2 days then 1 tab  x 2 days then 1/2 tab x 2 days  Dispense: 10 tablet; Refill: 0 - triamcinolone cream (KENALOG) 0.1 %; Apply 1 Application topically 2 (two) times daily.  Dispense: 30 g; Refill: 1 - Ambulatory referral to Dermatology - CBC With Diff/Platelet  2. Ulcerative colitis without complications, unspecified location ([place]) No recent flares.  Recently changed from Humira to Stelara but has developed a rash with the Stelara - CBC With Diff/Platelet - Comprehensive metabolic panel  3. Stage 3a chronic kidney disease (HCC) Stable.  Recheck chemistry today  4. Need for shingles vaccine Due for shingles vaccine.  I have printed the prescription and given it to him to get at any outside pharmacy.  Advised to wait at least 2 to 3 weeks until current rash resolves before getting the shingles vaccine.  He expressed understanding. - Zoster Vaccine Adjuvanted [facility]) injection; Inject 0.5 mLs into the muscle once for 1 dose.  Dispense: 0.5 mL; Refill: 0  Patient was given the opportunity to ask questions.  Patient verbalized understanding of the plan and was able to repeat key elements of the plan.   This documentation was completed using Radio producer.  Any transcriptional errors are unintentional.  No orders of the defined types were placed in this encounter.    Requested Prescriptions    No prescriptions requested or ordered in this encounter    No follow-ups on file.  Karle Plumber, MD, FACP

## 2022-11-09 NOTE — Progress Notes (Signed)
Runny nose for 2 days Weakness

## 2022-11-10 LAB — COMPREHENSIVE METABOLIC PANEL
ALT: 14 IU/L (ref 0–44)
AST: 23 IU/L (ref 0–40)
Albumin/Globulin Ratio: 1.5 (ref 1.2–2.2)
Albumin: 4.3 g/dL (ref 3.8–4.8)
Alkaline Phosphatase: 61 IU/L (ref 44–121)
BUN/Creatinine Ratio: 12 (ref 10–24)
BUN: 18 mg/dL (ref 8–27)
Bilirubin Total: 1.4 mg/dL — ABNORMAL HIGH (ref 0.0–1.2)
CO2: 25 mmol/L (ref 20–29)
Calcium: 9.9 mg/dL (ref 8.6–10.2)
Chloride: 102 mmol/L (ref 96–106)
Creatinine, Ser: 1.48 mg/dL — ABNORMAL HIGH (ref 0.76–1.27)
Globulin, Total: 2.9 g/dL (ref 1.5–4.5)
Glucose: 90 mg/dL (ref 70–99)
Potassium: 4.8 mmol/L (ref 3.5–5.2)
Sodium: 139 mmol/L (ref 134–144)
Total Protein: 7.2 g/dL (ref 6.0–8.5)
eGFR: 50 mL/min/{1.73_m2} — ABNORMAL LOW (ref >59)

## 2022-11-10 LAB — CBC WITH DIFF/PLATELET
Basophils Absolute: 0 10*3/uL (ref 0.0–0.2)
Basos: 0 %
EOS (ABSOLUTE): 0.1 10*3/uL (ref 0.0–0.4)
Eos: 1 %
Hematocrit: 43.3 % (ref 37.5–51.0)
Hemoglobin: 14.9 g/dL (ref 13.0–17.7)
Immature Grans (Abs): 0 10*3/uL (ref 0.0–0.1)
Immature Granulocytes: 0 %
Lymphocytes Absolute: 1 10*3/uL (ref 0.7–3.1)
Lymphs: 16 %
MCH: 30 pg (ref 26.6–33.0)
MCHC: 34.4 g/dL (ref 31.5–35.7)
MCV: 87 fL (ref 79–97)
Monocytes Absolute: 0.8 10*3/uL (ref 0.1–0.9)
Monocytes: 14 %
Neutrophils Absolute: 4 10*3/uL (ref 1.4–7.0)
Neutrophils: 69 %
Platelets: 225 10*3/uL (ref 150–450)
RBC: 4.97 x10E6/uL (ref 4.14–5.80)
RDW: 11.7 % (ref 11.6–15.4)
WBC: 5.9 10*3/uL (ref 3.4–10.8)

## 2022-11-15 DIAGNOSIS — R21 Rash and other nonspecific skin eruption: Secondary | ICD-10-CM | POA: Diagnosis not present

## 2022-11-15 DIAGNOSIS — K5190 Ulcerative colitis, unspecified, without complications: Secondary | ICD-10-CM | POA: Diagnosis not present

## 2022-11-17 ENCOUNTER — Telehealth: Payer: Self-pay

## 2022-11-17 NOTE — Telephone Encounter (Signed)
Pt given lab results per notes of Dr. Wynetta Emery on 11/17/22. Pt verbalized understanding.

## 2022-11-20 ENCOUNTER — Telehealth: Payer: Self-pay

## 2022-11-20 DIAGNOSIS — N1831 Chronic kidney disease, stage 3a: Secondary | ICD-10-CM

## 2022-11-20 NOTE — Telephone Encounter (Signed)
Pt called for lab results. Shared provider's note.  Ladell Pier, MD 11/14/2022  9:33 PM EST     Let pt know that his kidney function is not 100%.  Avoid OTC pain meds like Advil, Aleve, Motrin, Ibuprofen as these can make kidney function worse.  He has mild elevation in one of his liver function test which we can observe for now.  Blood cell counts are normal.  Please inquire if the rash is resolving with the Prednisone that I prescribed.      Pt states that rash is slowly going away.   Pt would like to know what else he can do to help his kidneys.  Please advise.

## 2022-11-20 NOTE — Telephone Encounter (Signed)
Noted  

## 2022-11-22 NOTE — Telephone Encounter (Signed)
Patient called and says he's calling for his lab results.I advised he already received them on 11/20/22, however there is a note that he would like to know what else he can do to help with his kidneys. He says yes he wants to know that from Dr. Wynetta Emery. He says he doesn't take any OTC medication. I advised I will get this to Dr. Wynetta Emery and someone will call back with her recommendation, patient verbalized understanding.

## 2022-11-23 NOTE — Telephone Encounter (Signed)
Patient wants to know what else he can do to help with his Kidney function.

## 2022-11-24 NOTE — Telephone Encounter (Signed)
Called LVM to call back

## 2022-11-29 NOTE — Addendum Note (Signed)
Addended by: Karle Plumber B on: 11/29/2022 02:25 PM   Modules accepted: Orders

## 2022-12-08 ENCOUNTER — Ambulatory Visit: Payer: Medicare HMO | Attending: Internal Medicine | Admitting: Internal Medicine

## 2022-12-08 ENCOUNTER — Encounter: Payer: Self-pay | Admitting: Internal Medicine

## 2022-12-08 VITALS — BP 107/64 | HR 73 | Temp 98.2°F | Ht 72.0 in | Wt 203.0 lb

## 2022-12-08 DIAGNOSIS — L309 Dermatitis, unspecified: Secondary | ICD-10-CM

## 2022-12-08 DIAGNOSIS — N1831 Chronic kidney disease, stage 3a: Secondary | ICD-10-CM | POA: Diagnosis not present

## 2022-12-08 DIAGNOSIS — K5190 Ulcerative colitis, unspecified, without complications: Secondary | ICD-10-CM | POA: Diagnosis not present

## 2022-12-08 DIAGNOSIS — R1030 Lower abdominal pain, unspecified: Secondary | ICD-10-CM | POA: Diagnosis not present

## 2022-12-08 MED ORDER — TRIAMCINOLONE ACETONIDE 0.1 % EX CREA: 1.0000 | TOPICAL_CREAM | Freq: Two times a day (BID) | CUTANEOUS | 1 refills | Status: DC

## 2022-12-08 NOTE — Progress Notes (Signed)
Patient ID: Raymond Franco, male    DOB: [birth year]  MRN: [id number]  CC: Rash (Rash f/u. Med refill - triamcinolone/Weakness, bloating, pain lower area of abdomen X2 days/No to flu vax)   Subjective: Raymond Franco is a 72 y.o. male who presents for 1 mth f/u  His concerns today include:  Patient with history of HL, CKD stage II-III, varicose vein, ulcerative colitis    On last visit, patient had generalized rash that started a few days after he took first dose of Stelara.  Patient was given prednisone taper and triamcinolone cream.  Advised to hold off on further Stelara injections until he saw his gastroenterologist Dr. Benson Norway.  He was also referred to dermatology.  Today: Reports rash went away within 2-3 days of taking the Prednisone and Triamcinolone cream.  Residual remain on LT knee.  Request RF on Triamcinolone.  No appt with derm as yet.  He did see Dr. Ronalee Red.  Told rash may have been shingles.  Patient states he was not clear whether he was to continue the Stelara or not.  C/o some abdominal pain and bloating that started around 1-2 a.m this morning. Attributes it to some 2-3 days left over beef stew that he ate last night.  No diarrhea, N/V, fever.  No cramps, it was like a draining feeling.  Symptoms better now.  Last BM was this morning and was normal  Pt with CKD 3 with GFR ranging 63-50.  He requested nephrology referral; this referral was submitted.     Patient Active Problem List   Diagnosis Date Noted   Stage 3a chronic kidney disease ([place]) 11/09/2022   Ulcerative colitis with rectal bleeding ([place]) 02/24/2020   Over weight 02/24/2020   Corn of toe 12/05/2018   CKD (chronic kidney disease) stage 2, GFR 60-89 ml/min 11/18/2018   Acute pain of both knees 03/29/2017   Subcutaneous nodules 03/29/2017   Varicose vein of leg 10/20/2016   Bunion 10/20/2016   Hyperlipidemia 10/01/2015     Current Outpatient Medications on File Prior to Visit  Medication Sig Dispense  Refill   dorzolamide-timolol (COSOPT) 2-0.5 % ophthalmic solution Place 1 drop into the left eye 2 (two) times daily.     Elastic Bandages & Supports (MEDICAL COMPRESSION STOCKINGS) MISC 30 mm Hg pressure 2 each 0   HUMIRA PEN-CD/UC/HS STARTER 80 MG/0.8ML PNKT Inject into the skin.     latanoprost (XALATAN) 0.005 % ophthalmic solution      Multiple Vitamin (MULTIVITAMIN) tablet Take 1 tablet by mouth daily.     predniSONE (DELTASONE) 20 MG tablet 2 tabs PO daily x 2 days then 1.5 tab PO x 2 days then 1 tab x 2 days then 1/2 tab x 2 days 10 tablet 0   STELARA 130 MG/26ML SOLN injection Inject into the vein.     triamcinolone cream (KENALOG) 0.1 % Apply 1 Application topically 2 (two) times daily. 30 g 1   No current facility-administered medications on file prior to visit.    No Known Allergies  Social History   Socioeconomic History   Marital status: Single    Spouse name: Not on file   Number of children: Not on file   Years of education: Not on file   Highest education level: Not on file  Occupational History   Not on file  Tobacco Use   Smoking status: Never   Smokeless tobacco: Never  Substance and Sexual Activity   Alcohol use: Yes    Comment:  RARE   Drug use: No   Sexual activity: Not on file  Other Topics Concern   Not on file  Social History Narrative   Not on file   Social Determinants of Health   Financial Resource Strain: Not on file  Food Insecurity: Not on file  Transportation Needs: Not on file  Physical Activity: Not on file  Stress: Not on file  Social Connections: Not on file  Intimate Partner Violence: Not on file    No family history on file.  Past Surgical History:  Procedure Laterality Date   LIPOMA EXCISION N/A 02/23/2014   Procedure: EXCISION LIPOMA ON BACK;  Surgeon: Theodoro Kos, DO;  Location: [facility];  Service: Plastics;  Laterality: N/A;   NASAL FRACTURE SURGERY  2013    ROS: Review of Systems Negative except  as stated above  PHYSICAL EXAM: BP 107/64 (BP Location: Left Arm, Patient Position: Sitting, Cuff Size: Normal)   Pulse 73   Temp 98.2 F (36.8 C) (Oral)   Ht 6' (1.829 m)   Wt 203 lb (92.1 kg)   SpO2 96%   BMI 27.53 kg/m   Physical Exam   General appearance - alert, well appearing, and in no distress Mental status - normal mood, behavior, speech, dress, motor activity, and thought processes Abdomen - soft, nontender, nondistended, no masses or organomegaly.  Bowel sounds sluggish. Skin -rash on trunk and arms resolved.  Minor residual seen on the lateral aspect of the left knee.     Latest Ref Rng & Units 11/09/2022    4:42 PM 02/21/2022   11:25 AM 02/24/2020   11:20 AM  CMP  Glucose 70 - 99 mg/dL 90  85  74   BUN 8 - 27 mg/dL '18  20  14   '$ Creatinine 0.76 - 1.27 mg/dL 1.48  1.22  1.50   Sodium 134 - 144 mmol/L 139  141  141   Potassium 3.5 - 5.2 mmol/L 4.8  4.7  4.0   Chloride 96 - 106 mmol/L 102  105  104   CO2 20 - 29 mmol/L '25  22  22   '$ Calcium 8.6 - 10.2 mg/dL 9.9  10.0  10.0   Total Protein 6.0 - 8.5 g/dL 7.2  7.4  7.5   Total Bilirubin 0.0 - 1.2 mg/dL 1.4  1.1  1.1   Alkaline Phos 44 - 121 IU/L 61  52  70   AST 0 - 40 IU/L '23  21  22   '$ ALT 0 - 44 IU/L '14  15  12    '$ Lipid Panel     Component Value Date/Time   CHOL 271 (H) 02/21/2022 1125   TRIG 97 02/21/2022 1125   HDL 57 02/21/2022 1125   CHOLHDL 4.8 02/21/2022 1125   CHOLHDL 4.5 10/20/2016 1146   VLDL 19 10/20/2016 1146   LDLCALC 197 (H) 02/21/2022 1125    CBC    Component Value Date/Time   WBC 5.9 11/09/2022 1642   WBC 4.8 09/30/2015 1117   RBC 4.97 11/09/2022 1642   RBC 4.87 09/30/2015 1117   HGB 14.9 11/09/2022 1642   HCT 43.3 11/09/2022 1642   PLT 225 11/09/2022 1642   MCV 87 11/09/2022 1642   MCH 30.0 11/09/2022 1642   MCH 29.4 09/30/2015 1117   MCHC 34.4 11/09/2022 1642   MCHC 33.4 09/30/2015 1117   RDW 11.7 11/09/2022 1642   LYMPHSABS 1.0 11/09/2022 1642   EOSABS 0.1 11/09/2022 1642  BASOSABS 0.0 11/09/2022 1642    ASSESSMENT AND PLAN:  1. Dermatitis About 98% resolved. Advised patient that this was definitely not shingles. Advised that he speak with his gastroenterologist for clarity on whether he wanted him to continue the Stelara or not. - triamcinolone cream (KENALOG) 0.1 %; Apply 1 Application topically 2 (two) times daily.  Dispense: 30 g; Refill: 1  2. Stage 3a chronic kidney disease ([place]) Stable for the most part.  Referred to nephrology per patient request.  Avoid NSAIDs.  3. Lower abdominal pain Patient reports that this is resolving.  Doubt flare of ulcerative colitis.  No further action at this time.    Patient was given the opportunity to ask questions.  Patient verbalized understanding of the plan and was able to repeat key elements of the plan.   This documentation was completed using Radio producer.  Any transcriptional errors are unintentional.  No orders of the defined types were placed in this encounter.    Requested Prescriptions    No prescriptions requested or ordered in this encounter    No follow-ups on file.  Karle Plumber, MD, FACP

## 2022-12-11 ENCOUNTER — Ambulatory Visit: Payer: Medicare HMO | Admitting: Internal Medicine

## 2022-12-27 DIAGNOSIS — K5190 Ulcerative colitis, unspecified, without complications: Secondary | ICD-10-CM | POA: Diagnosis not present

## 2022-12-27 DIAGNOSIS — R21 Rash and other nonspecific skin eruption: Secondary | ICD-10-CM | POA: Diagnosis not present

## 2022-12-28 DIAGNOSIS — K5190 Ulcerative colitis, unspecified, without complications: Secondary | ICD-10-CM | POA: Diagnosis not present

## 2023-01-17 ENCOUNTER — Other Ambulatory Visit: Payer: Self-pay | Admitting: Internal Medicine

## 2023-01-17 DIAGNOSIS — L309 Dermatitis, unspecified: Secondary | ICD-10-CM

## 2023-02-07 ENCOUNTER — Other Ambulatory Visit: Payer: Self-pay | Admitting: Internal Medicine

## 2023-02-07 DIAGNOSIS — L309 Dermatitis, unspecified: Secondary | ICD-10-CM

## 2023-02-08 NOTE — Telephone Encounter (Signed)
Requested medication (s) are due for refill today: yes  Requested medication (s) are on the active medication list: yes  Last refill:  01/17/23  Future visit scheduled: yes  Notes to clinic:  med not delegated to NT to RF   Requested Prescriptions  Pending Prescriptions Disp Refills   triamcinolone cream (KENALOG) 0.1 % [Pharmacy Med Name: Triamcinolone Acetonide 0.1 % External Cream] 30 g 0    Sig: APPLY 1 APPLICATION TOPICALLY TWICE DAILY     Not Delegated - Dermatology:  Corticosteroids Failed - 02/07/2023  4:57 PM      Failed - This refill cannot be delegated      Passed - Valid encounter within last 12 months    Recent Outpatient Visits           2 months ago Dermatitis   [place] [facility] Marcine Matar, MD   3 months ago Stage 3a chronic kidney disease [facility])   [place] [facility] Marcine Matar, MD   11 months ago Encounter for [employer] annual wellness exam   [facility] Marcine Matar, MD   2 years ago Acute diarrhea   [facility] Marcine Matar, MD   3 years ago Hordeolum internum of right lower eyelid   [place] [facility] Claiborne Rigg, NP       Future Appointments             In 4 months Laural Benes, Binnie Rail, MD [facility]

## 2023-03-13 ENCOUNTER — Other Ambulatory Visit: Payer: Self-pay | Admitting: Internal Medicine

## 2023-03-13 DIAGNOSIS — L309 Dermatitis, unspecified: Secondary | ICD-10-CM

## 2023-03-14 DIAGNOSIS — K5190 Ulcerative colitis, unspecified, without complications: Secondary | ICD-10-CM | POA: Diagnosis not present

## 2023-03-14 DIAGNOSIS — R21 Rash and other nonspecific skin eruption: Secondary | ICD-10-CM | POA: Diagnosis not present

## 2023-03-16 ENCOUNTER — Other Ambulatory Visit: Payer: Self-pay | Admitting: Nephrology

## 2023-03-16 DIAGNOSIS — N1831 Chronic kidney disease, stage 3a: Secondary | ICD-10-CM

## 2023-03-16 DIAGNOSIS — K5190 Ulcerative colitis, unspecified, without complications: Secondary | ICD-10-CM | POA: Diagnosis not present

## 2023-03-17 LAB — LAB REPORT - SCANNED
Albumin, Urine POC: 24.9
Albumin/Creatinine Ratio, Urine, POC: 7
Creatinine, POC: 342.2 mg/dL
EGFR: 57

## 2023-04-04 ENCOUNTER — Ambulatory Visit: Payer: Medicare HMO | Attending: Internal Medicine

## 2023-04-04 VITALS — Ht 72.0 in | Wt 203.0 lb

## 2023-04-04 DIAGNOSIS — L309 Dermatitis, unspecified: Secondary | ICD-10-CM

## 2023-04-04 DIAGNOSIS — Z0000 Encounter for general adult medical examination without abnormal findings: Secondary | ICD-10-CM | POA: Diagnosis not present

## 2023-04-04 NOTE — Patient Instructions (Addendum)
Raymond Franco , Thank you for taking time to come for your Medicare Wellness Visit. I appreciate your ongoing commitment to your health goals. Please review the following plan we discussed and let me know if I can assist you in the future.   These are the goals we discussed:  Goals      Remain active and independent        This is a list of the screening recommended for you and due dates:  Health Maintenance  Topic Date Due   Zoster (Shingles) Vaccine (2 of 2) 04/18/2022   COVID-19 Vaccine (3 - 2023-24 season) 06/09/2022   Pneumonia Vaccine (2 of 2 - PCV) 11/10/2023*   Flu Shot  05/10/2023   Medicare Annual Wellness Visit  04/03/2024   DTaP/Tdap/Td vaccine (2 - Td or Tdap) 10/20/2026   Colon Cancer Screening  04/27/2032   Hepatitis C Screening  Completed   HPV Vaccine  Aged Out  *Topic was postponed. The date shown is not the original due date.    Advanced directives: Information on Advanced Care Planning can be found at [facility] Advance Health Care Directives Advance Health Care Directives ([email or web address] Please bring a copy of your health care power of attorney and living will to the office to be added to your chart at your convenience.  Conditions/risks identified: Aim for 30 minutes of exercise or brisk walking, 6-8 glasses of water, and 5 servings of fruits and vegetables each day.  Next appointment: Follow up in one year for your annual wellness visit.   Preventive Care 47 Years and Older, Male  Preventive care refers to lifestyle choices and visits with your health care provider that can promote health and wellness. What does preventive care include? A yearly physical exam. This is also called an annual well check. Dental exams once or twice a year. Routine eye exams. Ask your health care provider how often you should have your eyes checked. Personal lifestyle choices, including: Daily care of your teeth and gums. Regular physical activity. Eating a  healthy diet. Avoiding tobacco and drug use. Limiting alcohol use. Practicing safe sex. Taking low doses of aspirin every day. Taking vitamin and mineral supplements as recommended by your health care provider. What happens during an annual well check? The services and screenings done by your health care provider during your annual well check will depend on your age, overall health, lifestyle risk factors, and family history of disease. Counseling  Your health care provider may ask you questions about your: Alcohol use. Tobacco use. Drug use. Emotional well-being. Home and relationship well-being. Sexual activity. Eating habits. History of falls. Memory and ability to understand (cognition). Work and work Astronomer. Screening  You may have the following tests or measurements: Height, weight, and BMI. Blood pressure. Lipid and cholesterol levels. These may be checked every 5 years, or more frequently if you are over 49 years old. Skin check. Lung cancer screening. You may have this screening every year starting at age 50 if you have a 30-pack-year history of smoking and currently smoke or have quit within the past 15 years. Fecal occult blood test (FOBT) of the stool. You may have this test every year starting at age 51. Flexible sigmoidoscopy or colonoscopy. You may have a sigmoidoscopy every 5 years or a colonoscopy every 10 years starting at age 89. Prostate cancer screening. Recommendations will vary depending on your family history and other risks. Hepatitis C blood test. Hepatitis B blood test. Sexually transmitted  disease (STD) testing. Diabetes screening. This is done by checking your blood sugar (glucose) after you have not eaten for a while (fasting). You may have this done every 1-3 years. Abdominal aortic aneurysm (AAA) screening. You may need this if you are a current or former smoker. Osteoporosis. You may be screened starting at age 66 if you are at high risk. Talk  with your health care provider about your test results, treatment options, and if necessary, the need for more tests. Vaccines  Your health care provider may recommend certain vaccines, such as: Influenza vaccine. This is recommended every year. Tetanus, diphtheria, and acellular pertussis (Tdap, Td) vaccine. You may need a Td booster every 10 years. Zoster vaccine. You may need this after age 30. Pneumococcal 13-valent conjugate (PCV13) vaccine. One dose is recommended after age 30. Pneumococcal polysaccharide (PPSV23) vaccine. One dose is recommended after age 90. Talk to your health care provider about which screenings and vaccines you need and how often you need them. This information is not intended to replace advice given to you by your health care provider. Make sure you discuss any questions you have with your health care provider. Document Released: 10/22/2015 Document Revised: 06/14/2016 Document Reviewed: 07/27/2015 Elsevier Interactive Patient Education  2017 [employer].  Fall Prevention in the Home Falls can cause injuries. They can happen to people of all ages. There are many things you can do to make your home safe and to help prevent falls. What can I do on the outside of my home? Regularly fix the edges of walkways and driveways and fix any cracks. Remove anything that might make you trip as you walk through a door, such as a raised step or threshold. Trim any bushes or trees on the path to your home. Use bright outdoor lighting. Clear any walking paths of anything that might make someone trip, such as rocks or tools. Regularly check to see if handrails are loose or broken. Make sure that both sides of any steps have handrails. Any raised decks and porches should have guardrails on the edges. Have any leaves, snow, or ice cleared regularly. Use sand or salt on walking paths during winter. Clean up any spills in your garage right away. This includes oil or grease  spills. What can I do in the bathroom? Use night lights. Install grab bars by the toilet and in the tub and shower. Do not use towel bars as grab bars. Use non-skid mats or decals in the tub or shower. If you need to sit down in the shower, use a plastic, non-slip stool. Keep the floor dry. Clean up any water that spills on the floor as soon as it happens. Remove soap buildup in the tub or shower regularly. Attach bath mats securely with double-sided non-slip rug tape. Do not have throw rugs and other things on the floor that can make you trip. What can I do in the bedroom? Use night lights. Make sure that you have a light by your bed that is easy to reach. Do not use any sheets or blankets that are too big for your bed. They should not hang down onto the floor. Have a firm chair that has side arms. You can use this for support while you get dressed. Do not have throw rugs and other things on the floor that can make you trip. What can I do in the kitchen? Clean up any spills right away. Avoid walking on wet floors. Keep items that you use a  lot in easy-to-reach places. If you need to reach something above you, use a strong step stool that has a grab bar. Keep electrical cords out of the way. Do not use floor polish or wax that makes floors slippery. If you must use wax, use non-skid floor wax. Do not have throw rugs and other things on the floor that can make you trip. What can I do with my stairs? Do not leave any items on the stairs. Make sure that there are handrails on both sides of the stairs and use them. Fix handrails that are broken or loose. Make sure that handrails are as long as the stairways. Check any carpeting to make sure that it is firmly attached to the stairs. Fix any carpet that is loose or worn. Avoid having throw rugs at the top or bottom of the stairs. If you do have throw rugs, attach them to the floor with carpet tape. Make sure that you have a light switch at the  top of the stairs and the bottom of the stairs. If you do not have them, ask someone to add them for you. What else can I do to help prevent falls? Wear shoes that: Do not have high heels. Have rubber bottoms. Are comfortable and fit you well. Are closed at the toe. Do not wear sandals. If you use a stepladder: Make sure that it is fully opened. Do not climb a closed stepladder. Make sure that both sides of the stepladder are locked into place. Ask someone to hold it for you, if possible. Clearly mark and make sure that you can see: Any grab bars or handrails. First and last steps. Where the edge of each step is. Use tools that help you move around (mobility aids) if they are needed. These include: Canes. Walkers. Scooters. Crutches. Turn on the lights when you go into a dark area. Replace any light bulbs as soon as they burn out. Set up your furniture so you have a clear path. Avoid moving your furniture around. If any of your floors are uneven, fix them. If there are any pets around you, be aware of where they are. Review your medicines with your doctor. Some medicines can make you feel dizzy. This can increase your chance of falling. Ask your doctor what other things that you can do to help prevent falls. This information is not intended to replace advice given to you by your health care provider. Make sure you discuss any questions you have with your health care provider. Document Released: 07/22/2009 Document Revised: 03/02/2016 Document Reviewed: 10/30/2014 Elsevier Interactive Patient Education  2017 [employer].

## 2023-04-04 NOTE — Progress Notes (Signed)
Subjective:   Raymond Franco is a 72 y.o. male who presents for Medicare Annual/Subsequent preventive examination.  Visit Complete: Virtual  I connected with  [name] on 04/04/23 by a audio enabled telemedicine application and verified that I am speaking with the correct person using two identifiers.  Patient Location: Home  Provider Location: Home Office  I discussed the limitations of evaluation and management by telemedicine. The patient expressed understanding and agreed to proceed.  Review of Systems     Cardiac Risk Factors include: advanced age (>31men, >73 women);dyslipidemia;male gender     Objective:    Today's Vitals   04/04/23 1526  Weight: 203 lb (92.1 kg)  Height: 6' (1.829 m)   Body mass index is 27.53 kg/m.     04/04/2023    3:44 PM 02/21/2022   10:12 AM 04/13/2020   10:40 AM 04/13/2017   10:55 AM 03/29/2017   11:03 AM 10/20/2016   11:07 AM 01/10/2016   11:04 AM  Advanced Directives  Does Patient Have a Medical Advance Directive? No No No No No No No  Would patient like information on creating a medical advance directive? Yes (MAU/Ambulatory/Procedural Areas - Information given) No - Patient declined No - Patient declined No - Patient declined  No - Patient declined     Current Medications (verified) Outpatient Encounter Medications as of 04/04/2023  Medication Sig   dorzolamide-timolol (COSOPT) 2-0.5 % ophthalmic solution Place 1 drop into the left eye 2 (two) times daily.   Elastic Bandages & Supports (MEDICAL COMPRESSION STOCKINGS) MISC 30 mm Hg pressure   HUMIRA PEN-CD/UC/HS STARTER 80 MG/0.8ML PNKT Inject into the skin.   latanoprost (XALATAN) 0.005 % ophthalmic solution    Multiple Vitamin (MULTIVITAMIN) tablet Take 1 tablet by mouth daily.   STELARA 130 MG/26ML SOLN injection Inject into the vein.   triamcinolone cream (KENALOG) 0.1 % APPLY 1 APPLICATION TOPICALLY TWICE DAILY   No facility-administered encounter medications on file as of  04/04/2023.    Allergies (verified) Patient has no known allergies.   History: Past Medical History:  Diagnosis Date   Lipoma    Past Surgical History:  Procedure Laterality Date   LIPOMA EXCISION N/A 02/23/2014   Procedure: EXCISION LIPOMA ON BACK;  Surgeon: Wayland Denis, DO;  Location: [facility];  Service: Plastics;  Laterality: N/A;   NASAL FRACTURE SURGERY  2013   No family history on file. Social History   Socioeconomic History   Marital status: Single    Spouse name: Not on file   Number of children: Not on file   Years of education: Not on file   Highest education level: Not on file  Occupational History   Not on file  Tobacco Use   Smoking status: Never   Smokeless tobacco: Never  Substance and Sexual Activity   Alcohol use: Yes    Comment: RARE   Drug use: No   Sexual activity: Not on file  Other Topics Concern   Not on file  Social History Narrative   Not on file   Social Determinants of Health   Financial Resource Strain: Low Risk  (04/04/2023)   Overall Financial Resource Strain (CARDIA)    Difficulty of Paying Living Expenses: Not hard at all  Food Insecurity: No Food Insecurity (04/04/2023)   Hunger Vital Sign    Worried About Running Out of Food in the Last Year: Never true    Ran Out of Food in the Last Year: Never true  Transportation  Needs: No Transportation Needs (04/04/2023)   PRAPARE - Administrator, Civil Service (Medical): No    Lack of Transportation (Non-Medical): No  Physical Activity: Insufficiently Active (04/04/2023)   Exercise Vital Sign    Days of Exercise per Week: 3 days    Minutes of Exercise per Session: 30 min  Stress: No Stress Concern Present (04/04/2023)   [employer] of Occupational Health - Occupational Stress Questionnaire    Feeling of Stress : Not at all  Social Connections: Moderately Integrated (04/04/2023)   Social Connection and Isolation Panel [NHANES]    Frequency of  Communication with Friends and Family: More than three times a week    Frequency of Social Gatherings with Friends and Family: Three times a week    Attends Religious Services: More than 4 times per year    Active Member of Clubs or Organizations: No    Attends Banker Meetings: Never    Marital Status: Living with partner    Tobacco Counseling Counseling given: Not Answered   Clinical Intake:  Pre-visit preparation completed: Yes  Pain : No/denies pain     Diabetes: No  How often do you need to have someone help you when you read instructions, pamphlets, or other written materials from your doctor or pharmacy?: 1 - Never  Interpreter Needed?: No  Information entered by :: Kandis Fantasia LPN   Activities of Daily Living    04/04/2023    3:27 PM  In your present state of health, do you have any difficulty performing the following activities:  Hearing? 0  Vision? 0  Difficulty concentrating or making decisions? 0  Walking or climbing stairs? 0  Dressing or bathing? 0  Doing errands, shopping? 0  Preparing Food and eating ? N  Using the Toilet? N  In the past six months, have you accidently leaked urine? N  Do you have problems with loss of bowel control? N  Managing your Medications? N  Managing your Finances? N  Housekeeping or managing your Housekeeping? N    Patient Care Team: Marcine Matar, MD as PCP - General (Internal Medicine)  Indicate any recent Medical Services you may have received from other than [facility] providers in the past year (date may be approximate).     Assessment:   This is a routine wellness examination for [facility].  Hearing/Vision screen Hearing Screening - Comments:: Denies hearing difficulties   Vision Screening - Comments:: up to date with routine eye exams with Dr. Coralee North    Dietary issues and exercise activities discussed:     Goals Addressed             This Visit's Progress    Remain active and  independent        Depression Screen    04/04/2023    3:43 PM 12/08/2022   11:08 AM 11/09/2022    3:42 PM 04/13/2020   10:40 AM 02/24/2020   10:36 AM 05/12/2019    4:14 PM 01/22/2018    9:28 AM  PHQ 2/9 Scores  PHQ - 2 Score 0 0 0 0 0 0 0  PHQ- 9 Score  0 0   0 0    Fall Risk    04/04/2023    3:45 PM 12/08/2022   11:04 AM 11/09/2022    3:41 PM 02/21/2022   10:04 AM 04/13/2020   10:40 AM  Fall Risk   Falls in the past year? 0 0 0 0 0  Number  falls in past yr: 0 0 0 0   Injury with Fall? 0 0 0 0   Risk for fall due to : No Fall Risks No Fall Risks  No Fall Risks   Follow up Falls prevention discussed;Education provided;Falls evaluation completed   Falls evaluation completed     MEDICARE RISK AT HOME:  Medicare Risk at Home - 04/04/23 1545     Any stairs in or around the home? Yes    If so, are there any without handrails? No    Home free of loose throw rugs in walkways, pet beds, electrical cords, etc? Yes    Adequate lighting in your home to reduce risk of falls? Yes    Life alert? No    Use of a cane, walker or w/c? No    Grab bars in the bathroom? Yes    Shower chair or bench in shower? No    Elevated toilet seat or a handicapped toilet? No             TIMED UP AND GO:  Was the test performed?  No    Cognitive Function:    02/21/2022   10:10 AM 04/13/2020   10:41 AM  MMSE - Mini Mental State Exam  Orientation to time 5 5  Orientation to Place 5 5  Registration 3 3  Attention/ Calculation 5 5  Recall 3 3  Language- name 2 objects 2 2  Language- repeat 1 1  Language- follow 3 step command 3 3  Language- read & follow direction 1 1  Write a sentence 1 1  Copy design  1  Total score  30        04/04/2023    3:45 PM 02/21/2022   10:06 AM  6CIT Screen  What Year? 0 points 0 points  What month? 0 points 0 points  What time? 0 points 0 points  Count back from 20 0 points 0 points  Months in reverse 0 points 0 points  Repeat phrase 2 points 8 points  Total Score  2 points 8 points    Immunizations Immunization History  Administered Date(s) Administered   Moderna Sars-Covid-2 Vaccination 11/02/2019, 11/30/2019   Pneumococcal Polysaccharide-23 10/20/2016   Tdap 10/20/2016   Zoster Recombinat (Shingrix) 02/21/2022    TDAP status: Up to date  Pneumococcal vaccine status: Due, Education has been provided regarding the importance of this vaccine. Advised may receive this vaccine at local pharmacy or Health Dept. Aware to provide a copy of the vaccination record if obtained from local pharmacy or Health Dept. Verbalized acceptance and understanding.  Covid-19 vaccine status: Information provided on how to obtain vaccines.   Qualifies for Shingles Vaccine? Yes   Zostavax completed No   Shingrix Completed?: No.    Education has been provided regarding the importance of this vaccine. Patient has been advised to call insurance company to determine out of pocket expense if they have not yet received this vaccine. Advised may also receive vaccine at local pharmacy or Health Dept. Verbalized acceptance and understanding.  Screening Tests Health Maintenance  Topic Date Due   Zoster Vaccines- Shingrix (2 of 2) 04/18/2022   COVID-19 Vaccine (3 - 2023-24 season) 06/09/2022   Pneumonia Vaccine 74+ Years old (2 of 2 - PCV) 11/10/2023 (Originally 10/20/2017)   INFLUENZA VACCINE  05/10/2023   Medicare Annual Wellness (AWV)  04/03/2024   DTaP/Tdap/Td (2 - Td or Tdap) 10/20/2026   Colonoscopy  04/27/2032   Hepatitis C Screening  Completed  HPV VACCINES  Aged Out    Health Maintenance  Health Maintenance Due  Topic Date Due   Zoster Vaccines- Shingrix (2 of 2) 04/18/2022   COVID-19 Vaccine (3 - 2023-24 season) 06/09/2022    Colorectal cancer screening: Type of screening: Colonoscopy. Completed 04/27/22. Repeat every 10 years  Lung Cancer Screening: (Low Dose CT Chest recommended if Age 42-80 years, 20 pack-year currently smoking OR have quit w/in  15years.) does not qualify.   Lung Cancer Screening Referral: n/a  Additional Screening:  Hepatitis C Screening: does qualify; Completed 09/30/15  Vision Screening: Recommended annual ophthalmology exams for early detection of glaucoma and other disorders of the eye. Is the patient up to date with their annual eye exam?  Yes  Who is the provider or what is the name of the office in which the patient attends annual eye exams? Dr.Nicholas Grissom If pt is not established with a provider, would they like to be referred to a provider to establish care? No .   Dental Screening: Recommended annual dental exams for proper oral hygiene  Community Resource Referral / Chronic Care Management: CRR required this visit?  No   CCM required this visit?  No     Plan:     I have personally reviewed and noted the following in the patient's chart:   Medical and social history Use of alcohol, tobacco or illicit drugs  Current medications and supplements including opioid prescriptions. Patient is not currently taking opioid prescriptions. Functional ability and status Nutritional status Physical activity Advanced directives List of other physicians Hospitalizations, surgeries, and ER visits in previous 12 months Vitals Screenings to include cognitive, depression, and falls Referrals and appointments  In addition, I have reviewed and discussed with patient certain preventive protocols, quality metrics, and best practice recommendations. A written personalized care plan for preventive services as well as general preventive health recommendations were provided to patient.     Kandis Fantasia [facility] [place], [place]   06/22/7828   After Visit Summary: (Mail) Due to this being a telephonic visit, the after visit summary with patients personalized plan was offered to patient via mail   Nurse Notes: Patient is requesting a refill of his Triamcinolone cream in a bigger size if possible.

## 2023-05-01 ENCOUNTER — Other Ambulatory Visit: Payer: Self-pay | Admitting: Internal Medicine

## 2023-05-01 DIAGNOSIS — L309 Dermatitis, unspecified: Secondary | ICD-10-CM

## 2023-06-11 ENCOUNTER — Other Ambulatory Visit: Payer: Self-pay | Admitting: Internal Medicine

## 2023-06-11 DIAGNOSIS — L309 Dermatitis, unspecified: Secondary | ICD-10-CM

## 2023-06-12 ENCOUNTER — Encounter: Payer: Self-pay | Admitting: Internal Medicine

## 2023-06-12 ENCOUNTER — Ambulatory Visit: Payer: Medicare HMO | Attending: Internal Medicine | Admitting: Internal Medicine

## 2023-06-12 VITALS — BP 114/72 | HR 52 | Temp 97.6°F | Ht 72.0 in | Wt 202.0 lb

## 2023-06-12 DIAGNOSIS — L309 Dermatitis, unspecified: Secondary | ICD-10-CM

## 2023-06-12 DIAGNOSIS — K5190 Ulcerative colitis, unspecified, without complications: Secondary | ICD-10-CM

## 2023-06-12 DIAGNOSIS — N1831 Chronic kidney disease, stage 3a: Secondary | ICD-10-CM

## 2023-06-12 MED ORDER — TRIAMCINOLONE ACETONIDE 0.1 % EX CREA: TOPICAL_CREAM | Freq: Two times a day (BID) | CUTANEOUS | 1 refills | Status: DC

## 2023-06-12 NOTE — Patient Instructions (Addendum)
Speak with Dr. Renaldo Reel about the rash you develop every time you take Stelara shot. I have referred you to dermatology. Get the 2ns Shingles vaccine at your local pharmacy.

## 2023-06-12 NOTE — Progress Notes (Signed)
Patient ID: Raymond Franco, male    DOB: May 04, 1951  MRN: [id number]  CC: Chronic Kidney Disease (Chronic kidney disease f/u. Herold Harms a larger dosage of the triamcinolone cream - current dosage is not enough /No to flu vax)   Subjective: Raymond Franco is a 72 y.o. male who presents for chronic ds management His concerns today include:  Patient with history of HL, CKD stage II-III, varicose vein, ulcerative colitis    CKD 3a: He saw nephrologist Dr. Thedore Mins in June.  GFR was 57.  Unclear of etiology.  However he recommended that the patient stay hydrated, maintain control of IBS and avoid nephrotoxic medications.  IBD:  saw Dr. Renaldo Reel in June.  Started back on Stelara Q 2 mths.  Would get breakouts on arms, legs and trunk when he gets the inj.  Triamcinolone clears it up.  Requesting refill in a larger tube.  Does not want to get the Stelara shots any more Referred to derm with [facility]; thinks he kept the appt but does not recall details.  I do not see a progress note from them scanned into the system.  HM: Initially declined the flu vaccine but then agreed.  We do not have the high-dose flu vaccine currently.  Due for second Shingrix vaccine.  Encouraged him to get this at his local pharmacy. Patient Active Problem List   Diagnosis Date Noted   Stage 3a chronic kidney disease (HCC) 11/09/2022   Ulcerative colitis with rectal bleeding (HCC) 02/24/2020   Over weight 02/24/2020   Corn of toe 12/05/2018   CKD (chronic kidney disease) stage 2, GFR 60-89 ml/min 11/18/2018   Acute pain of both knees 03/29/2017   Subcutaneous nodules 03/29/2017   Varicose vein of leg 10/20/2016   Bunion 10/20/2016   Hyperlipidemia 10/01/2015     Current Outpatient Medications on File Prior to Visit  Medication Sig Dispense Refill   dorzolamide-timolol (COSOPT) 2-0.5 % ophthalmic solution Place 1 drop into the left eye 2 (two) times daily.     Elastic Bandages & Supports (MEDICAL COMPRESSION  STOCKINGS) MISC 30 mm Hg pressure 2 each 0   Multiple Vitamin (MULTIVITAMIN) tablet Take 1 tablet by mouth daily.     STELARA 130 MG/26ML SOLN injection Inject into the vein.     latanoprost (XALATAN) 0.005 % ophthalmic solution  (Patient not taking: Reported on 06/12/2023)     No current facility-administered medications on file prior to visit.    No Known Allergies  Social History   Socioeconomic History   Marital status: Single    Spouse name: Not on file   Number of children: Not on file   Years of education: Not on file   Highest education level: Not on file  Occupational History   Not on file  Tobacco Use   Smoking status: Never   Smokeless tobacco: Never  Substance and Sexual Activity   Alcohol use: Yes    Comment: RARE   Drug use: No   Sexual activity: Not on file  Other Topics Concern   Not on file  Social History Narrative   Not on file   Social Determinants of Health   Financial Resource Strain: Low Risk  (04/04/2023)   Overall Financial Resource Strain (CARDIA)    Difficulty of Paying Living Expenses: Not hard at all  Food Insecurity: No Food Insecurity (04/04/2023)   Hunger Vital Sign    Worried About Running Out of Food in the Last Year: Never true  Ran Out of Food in the Last Year: Never true  Transportation Needs: No Transportation Needs (04/04/2023)   PRAPARE - Administrator, Civil Service (Medical): No    Lack of Transportation (Non-Medical): No  Physical Activity: Insufficiently Active (04/04/2023)   Exercise Vital Sign    Days of Exercise per Week: 3 days    Minutes of Exercise per Session: 30 min  Stress: No Stress Concern Present (04/04/2023)   [employer] of Occupational Health - Occupational Stress Questionnaire    Feeling of Stress : Not at all  Social Connections: Moderately Integrated (04/04/2023)   Social Connection and Isolation Panel [NHANES]    Frequency of Communication with Friends and Family: More than three times a  week    Frequency of Social Gatherings with Friends and Family: Three times a week    Attends Religious Services: More than 4 times per year    Active Member of Clubs or Organizations: No    Attends Banker Meetings: Never    Marital Status: Living with partner  Intimate Partner Violence: Not At Risk (04/04/2023)   Humiliation, Afraid, Rape, and Kick questionnaire    Fear of Current or Ex-Partner: No    Emotionally Abused: No    Physically Abused: No    Sexually Abused: No    No family history on file.  Past Surgical History:  Procedure Laterality Date   LIPOMA EXCISION N/A 02/23/2014   Procedure: EXCISION LIPOMA ON BACK;  Surgeon: Wayland Denis, DO;  Location: [facility];  Service: Plastics;  Laterality: N/A;   NASAL FRACTURE SURGERY  2013    ROS: Review of Systems Negative except as stated above  PHYSICAL EXAM: BP 114/72 (BP Location: Left Arm, Patient Position: Sitting, Cuff Size: Normal)   Pulse (!) 52   Temp 97.6 F (36.4 C) (Oral)   Ht 6' (1.829 m)   Wt 202 lb (91.6 kg)   SpO2 99%   BMI 27.40 kg/m   Physical Exam   General appearance - alert, well appearing, and in no distress Mental status - normal mood, behavior, speech, dress, motor activity, and thought processes Chest - clear to auscultation, no wheezes, rales or rhonchi, symmetric air entry Heart - normal rate, regular rhythm, normal S1, S2, no murmurs, rubs, clicks or gallops Skin -patient has scaly plaque-like confluent rash over both elbows.  Similar rash most extensive on the left lateral leg.     Latest Ref Rng & Units 11/09/2022    4:42 PM 02/21/2022   11:25 AM 02/24/2020   11:20 AM  CMP  Glucose 70 - 99 mg/dL 90  85  74   BUN 8 - 27 mg/dL 18  20  14    Creatinine 0.76 - 1.27 mg/dL 5.78  4.69  6.29   Sodium 134 - 144 mmol/L 139  141  141   Potassium 3.5 - 5.2 mmol/L 4.8  4.7  4.0   Chloride 96 - 106 mmol/L 102  105  104   CO2 20 - 29 mmol/L 25  22  22    Calcium 8.6 -  10.2 mg/dL 9.9  52.8  41.3   Total Protein 6.0 - 8.5 g/dL 7.2  7.4  7.5   Total Bilirubin 0.0 - 1.2 mg/dL 1.4  1.1  1.1   Alkaline Phos 44 - 121 IU/L 61  52  70   AST 0 - 40 IU/L 23  21  22    ALT 0 - 44 IU/L 14  15  12    Lipid Panel     Component Value Date/Time   CHOL 271 (H) 02/21/2022 1125   TRIG 97 02/21/2022 1125   HDL 57 02/21/2022 1125   CHOLHDL 4.8 02/21/2022 1125   CHOLHDL 4.5 10/20/2016 1146   VLDL 19 10/20/2016 1146   LDLCALC 197 (H) 02/21/2022 1125    CBC    Component Value Date/Time   WBC 5.9 11/09/2022 1642   WBC 4.8 09/30/2015 1117   RBC 4.97 11/09/2022 1642   RBC 4.87 09/30/2015 1117   HGB 14.9 11/09/2022 1642   HCT 43.3 11/09/2022 1642   PLT 225 11/09/2022 1642   MCV 87 11/09/2022 1642   MCH 30.0 11/09/2022 1642   MCH 29.4 09/30/2015 1117   MCHC 34.4 11/09/2022 1642   MCHC 33.4 09/30/2015 1117   RDW 11.7 11/09/2022 1642   LYMPHSABS 1.0 11/09/2022 1642   EOSABS 0.1 11/09/2022 1642   BASOSABS 0.0 11/09/2022 1642    ASSESSMENT AND PLAN:  1. Stage 3a chronic kidney disease (HCC) Stable.  I reiterated recommendations that was put forth by the nephrologist including staying hydrated, maintaining control of IBD and avoiding NSAIDs.  2. Ulcerative colitis without complications, unspecified location [facility]) Advised patient to speak with Dr. Renaldo Reel about the fact that he continues to get flareup of this rash whenever he takes the Stelara.  May need to be changed to a different agent.  3. Dermatitis See #2 above.  Refill given on triamcinolone cream.  Resubmit referral to dermatology. - triamcinolone cream (KENALOG) 0.1 %; Apply topically 2 (two) times daily.  Dispense: 45 g; Refill: 1    Patient was given the opportunity to ask questions.  Patient verbalized understanding of the plan and was able to repeat key elements of the plan.   This documentation was completed using Paediatric nurse.  Any transcriptional errors are  unintentional.  Orders Placed This Encounter  Procedures   Ambulatory referral to Dermatology     Requested Prescriptions   Signed Prescriptions Disp Refills   triamcinolone cream (KENALOG) 0.1 % 45 g 1    Sig: Apply topically 2 (two) times daily.    Return in about 6 months (around 12/10/2023) for Nurse only visit in 2 wks for high dose flu vaccine.  Jonah Blue, MD, FACP

## 2023-08-14 ENCOUNTER — Other Ambulatory Visit: Payer: Self-pay | Admitting: Internal Medicine

## 2023-08-14 DIAGNOSIS — L309 Dermatitis, unspecified: Secondary | ICD-10-CM

## 2023-08-22 DIAGNOSIS — R21 Rash and other nonspecific skin eruption: Secondary | ICD-10-CM | POA: Diagnosis not present

## 2023-08-22 DIAGNOSIS — K5190 Ulcerative colitis, unspecified, without complications: Secondary | ICD-10-CM | POA: Diagnosis not present

## 2023-09-03 ENCOUNTER — Other Ambulatory Visit: Payer: Self-pay | Admitting: Internal Medicine

## 2023-09-03 DIAGNOSIS — L309 Dermatitis, unspecified: Secondary | ICD-10-CM

## 2023-09-14 DIAGNOSIS — K5190 Ulcerative colitis, unspecified, without complications: Secondary | ICD-10-CM | POA: Diagnosis not present

## 2023-09-17 ENCOUNTER — Other Ambulatory Visit: Payer: Self-pay | Admitting: Nephrology

## 2023-09-17 DIAGNOSIS — N1831 Chronic kidney disease, stage 3a: Secondary | ICD-10-CM | POA: Diagnosis not present

## 2023-09-17 DIAGNOSIS — K5190 Ulcerative colitis, unspecified, without complications: Secondary | ICD-10-CM | POA: Diagnosis not present

## 2023-09-17 LAB — LAB REPORT - SCANNED: EGFR: 64

## 2023-09-20 ENCOUNTER — Other Ambulatory Visit: Payer: Medicare HMO

## 2023-10-01 DIAGNOSIS — K5190 Ulcerative colitis, unspecified, without complications: Secondary | ICD-10-CM | POA: Diagnosis not present

## 2023-10-05 DIAGNOSIS — K5190 Ulcerative colitis, unspecified, without complications: Secondary | ICD-10-CM | POA: Diagnosis not present

## 2023-10-12 ENCOUNTER — Other Ambulatory Visit: Payer: Self-pay | Admitting: Internal Medicine

## 2023-10-12 DIAGNOSIS — L309 Dermatitis, unspecified: Secondary | ICD-10-CM

## 2023-11-01 DIAGNOSIS — K5090 Crohn's disease, unspecified, without complications: Secondary | ICD-10-CM | POA: Diagnosis not present

## 2023-11-01 DIAGNOSIS — K5190 Ulcerative colitis, unspecified, without complications: Secondary | ICD-10-CM | POA: Diagnosis not present

## 2023-11-20 ENCOUNTER — Ambulatory Visit: Payer: Medicare HMO | Attending: Internal Medicine

## 2023-11-20 VITALS — Wt 197.0 lb

## 2023-11-20 DIAGNOSIS — Z0000 Encounter for general adult medical examination without abnormal findings: Secondary | ICD-10-CM

## 2023-11-20 NOTE — Patient Instructions (Addendum)
Raymond Franco , Thank you for taking time to come for your Medicare Wellness Visit. I appreciate your ongoing commitment to your health goals. Please review the following plan we discussed and let me know if I can assist you in the future.   Referrals/Orders/Follow-Ups/Clinician Recommendations: Yes; Keep maintaining your health by keeping your appointments with Dr. Laural Benes and any specialists that you may see.  Call us if you need anything.  Have a great year!!!!  This is a list of the screening recommended for you and due dates:  Health Maintenance  Topic Date Due   Pneumonia Vaccine (2 of 2 - PCV) 10/20/2017   Zoster (Shingles) Vaccine (2 of 2) 04/18/2022   COVID-19 Vaccine (3 - 2024-25 season) 06/10/2023   Flu Shot  01/07/2024*   Medicare Annual Wellness Visit  11/19/2024   DTaP/Tdap/Td vaccine (2 - Td or Tdap) 10/20/2026   Colon Cancer Screening  04/27/2032   Hepatitis C Screening  Completed   HPV Vaccine  Aged Out  *Topic was postponed. The date shown is not the original due date.    Advanced directives: (Declined) Advance directive discussed with you today. Even though you declined this today, please call our office should you change your mind, and we can give you the proper paperwork for you to fill out.  Next Medicare Annual Wellness Visit scheduled for next year: Yes

## 2023-11-20 NOTE — Progress Notes (Signed)
Subjective:   Raymond Franco is a 73 y.o. male who presents for Medicare Annual/Subsequent preventive examination.  Visit Complete: Virtual I connected with  Raymond Franco on 11/20/23 by a audio enabled telemedicine application and verified that I am speaking with the correct person using two identifiers.  Patient Location: Home  Provider Location: Office/Clinic  I discussed the limitations of evaluation and management by telemedicine. The patient expressed understanding and agreed to proceed.  Vital Signs: Because this visit was a virtual/telehealth visit, some criteria may be missing or patient reported. Any vitals not documented were not able to be obtained and vitals that have been documented are patient reported.  This patient declined Interactive audio and Acupuncturist. Therefore the visit was completed with audio only.  Cardiac Risk Factors include: advanced age (>24men, >24 women);male gender     Objective:    Today's Vitals   11/20/23 1606  Weight: 197 lb (89.4 kg)  PainSc: 0-No pain   Body mass index is 26.72 kg/m.     11/20/2023    3:59 PM 04/04/2023    3:44 PM 02/21/2022   10:12 AM 04/13/2020   10:40 AM 04/13/2017   10:55 AM 03/29/2017   11:03 AM 10/20/2016   11:07 AM  Advanced Directives  Does Patient Have a Medical Advance Directive? No No No No No No No  Would patient like information on creating a medical advance directive? No - Patient declined Yes (MAU/Ambulatory/Procedural Areas - Information given) No - Patient declined No - Patient declined No - Patient declined  No - Patient declined    Current Medications (verified) Outpatient Encounter Medications as of 11/20/2023  Medication Sig   Elastic Bandages & Supports (MEDICAL COMPRESSION STOCKINGS) MISC 30 mm Hg pressure   Multiple Vitamin (MULTIVITAMIN) tablet Take 1 tablet by mouth daily.   STELARA 130 MG/26ML SOLN injection Inject into the vein.   triamcinolone cream (KENALOG) 0.1 % APPLY CREAM  EXTERNALLY TWICE DAILY   dorzolamide-timolol (COSOPT) 2-0.5 % ophthalmic solution Place 1 drop into the left eye 2 (two) times daily. (Patient not taking: Reported on 11/20/2023)   latanoprost (XALATAN) 0.005 % ophthalmic solution  (Patient not taking: Reported on 11/20/2023)   No facility-administered encounter medications on file as of 11/20/2023.    Allergies (verified) Patient has no known allergies.   History: Past Medical History:  Diagnosis Date   Lipoma    Past Surgical History:  Procedure Laterality Date   LIPOMA EXCISION N/A 02/23/2014   Procedure: EXCISION LIPOMA ON BACK;  Surgeon: Wayland Denis, DO;  Location: [facility];  Service: Plastics;  Laterality: N/A;   NASAL FRACTURE SURGERY  2013   History reviewed. No pertinent family history. Social History   Socioeconomic History   Marital status: Single    Spouse name: Not on file   Number of children: Not on file   Years of education: Not on file   Highest education level: Not on file  Occupational History   Not on file  Tobacco Use   Smoking status: Never   Smokeless tobacco: Never  Substance and Sexual Activity   Alcohol use: Yes    Comment: RARE   Drug use: No   Sexual activity: Not on file  Other Topics Concern   Not on file  Social History Narrative   Not on file   Social Drivers of Health   Financial Resource Strain: Low Risk  (11/20/2023)   Overall Financial Resource Strain (CARDIA)    Difficulty of Paying  Living Expenses: Not hard at all  Food Insecurity: No Food Insecurity (11/20/2023)   Hunger Vital Sign    Worried About Running Out of Food in the Last Year: Never true    Ran Out of Food in the Last Year: Never true  Transportation Needs: No Transportation Needs (11/20/2023)   PRAPARE - Administrator, Civil Service (Medical): No    Lack of Transportation (Non-Medical): No  Physical Activity: Sufficiently Active (11/20/2023)   Exercise Vital Sign    Days of Exercise  per Week: 4 days    Minutes of Exercise per Session: 70 min  Stress: No Stress Concern Present (11/20/2023)   [employer] of Occupational Health - Occupational Stress Questionnaire    Feeling of Stress : Not at all  Social Connections: Moderately Integrated (11/20/2023)   Social Connection and Isolation Panel [NHANES]    Frequency of Communication with Friends and Family: More than three times a week    Frequency of Social Gatherings with Friends and Family: Three times a week    Attends Religious Services: More than 4 times per year    Active Member of Clubs or Organizations: No    Attends Banker Meetings: Never    Marital Status: Living with partner    Tobacco Counseling Counseling given: Not Answered   Clinical Intake:  Pre-visit preparation completed: Yes  Pain : No/denies pain Pain Score: 0-No pain     BMI - recorded: 26.72 Nutritional Status: BMI 25 -29 Overweight Nutritional Risks: None Diabetes: No  How often do you need to have someone help you when you read instructions, pamphlets, or other written materials from your doctor or pharmacy?: 1 - Never What is the last grade level you completed in school?: Doctorate Degree in Marketing  Interpreter Needed?: No  Information entered by :: Rhyleigh Grassel N. Armanie Ullmer, LPN.   Activities of Daily Living    11/20/2023    4:10 PM 04/04/2023    3:27 PM  In your present state of health, do you have any difficulty performing the following activities:  Hearing? 0 0  Vision? 0 0  Difficulty concentrating or making decisions? 0 0  Walking or climbing stairs? 0 0  Dressing or bathing? 0 0  Doing errands, shopping? 0 0  Preparing Food and eating ? N N  Using the Toilet? N N  In the past six months, have you accidently leaked urine? N N  Do you have problems with loss of bowel control? N N  Managing your Medications? N N  Managing your Finances? N N  Housekeeping or managing your Housekeeping? N N     Patient Care Team: Marcine Matar, MD as PCP - General (Internal Medicine) Haynes Hoehn, MD as Consulting Physician (Ophthalmology)  Indicate any recent Medical Services you may have received from other than [facility] providers in the past year (date may be approximate).     Assessment:   This is a routine wellness examination for [facility].  Hearing/Vision screen Hearing Screening - Comments:: Denies hearing difficulties. No hearing aids.  Vision Screening - Comments:: Wears rx glasses - up to date with routine eye exams with Dr. Coralee North     Goals Addressed   None   Depression Screen    11/20/2023    4:07 PM 06/12/2023   11:55 AM 04/04/2023    3:43 PM 12/08/2022   11:08 AM 11/09/2022    3:42 PM 04/13/2020   10:40 AM 02/24/2020   10:36 AM  PHQ 2/9 Scores  PHQ - 2 Score 0 0 0 0 0 0 0  PHQ- 9 Score 0 0  0 0      Fall Risk    11/20/2023    3:59 PM 06/12/2023   11:55 AM 04/04/2023    3:45 PM 12/08/2022   11:04 AM 11/09/2022    3:41 PM  Fall Risk   Falls in the past year? 0 0 0 0 0  Number falls in past yr: 0 0 0 0 0  Injury with Fall? 0 0 0 0 0  Risk for fall due to : No Fall Risks No Fall Risks No Fall Risks No Fall Risks   Follow up Falls prevention discussed;Falls evaluation completed Falls evaluation completed Falls prevention discussed;Education provided;Falls evaluation completed      MEDICARE RISK AT HOME: Medicare Risk at Home Any stairs in or around the home?: Yes If so, are there any without handrails?: No Home free of loose throw rugs in walkways, pet beds, electrical cords, etc?: Yes Adequate lighting in your home to reduce risk of falls?: Yes Life alert?: No Use of a cane, walker or w/c?: No Grab bars in the bathroom?: No Shower chair or bench in shower?: No Elevated toilet seat or a handicapped toilet?: Yes  TIMED UP AND GO:  Was the test performed?  No    Cognitive Function:    11/20/2023    4:10 PM 02/21/2022   10:10 AM 04/13/2020   10:41  AM  MMSE - Mini Mental State Exam  Not completed: Unable to complete    Orientation to time  5 5  Orientation to Place  5 5  Registration  3 3  Attention/ Calculation  5 5  Recall  3 3  Language- name 2 objects  2 2  Language- repeat  1 1  Language- follow 3 step command  3 3  Language- read & follow direction  1 1  Write a sentence  1 1  Copy design   1  Total score   30        11/20/2023    4:00 PM 04/04/2023    3:45 PM 02/21/2022   10:06 AM  6CIT Screen  What Year? 0 points 0 points 0 points  What month? 0 points 0 points 0 points  What time? 0 points 0 points 0 points  Count back from 20 0 points 0 points 0 points  Months in reverse 0 points 0 points 0 points  Repeat phrase 0 points 2 points 8 points  Total Score 0 points 2 points 8 points    Immunizations Immunization History  Administered Date(s) Administered   Moderna Sars-Covid-2 Vaccination 11/02/2019, 11/30/2019   Pneumococcal Polysaccharide-23 10/20/2016   Tdap 10/20/2016   Zoster Recombinant(Shingrix) 02/21/2022    TDAP status: Up to date  Flu Vaccine status: Declined, Education has been provided regarding the importance of this vaccine but patient still declined. Advised may receive this vaccine at local pharmacy or Health Dept. Aware to provide a copy of the vaccination record if obtained from local pharmacy or Health Dept. Verbalized acceptance and understanding.  Pneumococcal vaccine status: Due, Education has been provided regarding the importance of this vaccine. Advised may receive this vaccine at local pharmacy or Health Dept. Aware to provide a copy of the vaccination record if obtained from local pharmacy or Health Dept. Verbalized acceptance and understanding.  Covid-19 vaccine status: Completed vaccines  Qualifies for Shingles Vaccine? Yes   Zostavax completed No  Shingrix Completed?: No.    Education has been provided regarding the importance of this vaccine. Patient has been advised to call  insurance company to determine out of pocket expense if they have not yet received this vaccine. Advised may also receive vaccine at local pharmacy or Health Dept. Verbalized acceptance and understanding.  Screening Tests Health Maintenance  Topic Date Due   Pneumonia Vaccine 85+ Years old (2 of 2 - PCV) 10/20/2017   Zoster Vaccines- Shingrix (2 of 2) 04/18/2022   COVID-19 Vaccine (3 - 2024-25 season) 06/10/2023   INFLUENZA VACCINE  01/07/2024 (Originally 05/10/2023)   Medicare Annual Wellness (AWV)  11/19/2024   DTaP/Tdap/Td (2 - Td or Tdap) 10/20/2026   Colonoscopy  04/27/2032   Hepatitis C Screening  Completed   HPV VACCINES  Aged Out    Health Maintenance  Health Maintenance Due  Topic Date Due   Pneumonia Vaccine 101+ Years old (2 of 2 - PCV) 10/20/2017   Zoster Vaccines- Shingrix (2 of 2) 04/18/2022   COVID-19 Vaccine (3 - 2024-25 season) 06/10/2023    Colorectal cancer screening: Type of screening: Colonoscopy. Completed 04/27/2022. Repeat every 10 years  Lung Cancer Screening: (Low Dose CT Chest recommended if Age 66-80 years, 20 pack-year currently smoking OR have quit w/in 15years.) does not qualify.   Lung Cancer Screening Referral: no  Additional Screening:  Hepatitis C Screening: does qualify; Completed 09/30/2015  Vision Screening: Recommended annual ophthalmology exams for early detection of glaucoma and other disorders of the eye. Is the patient up to date with their annual eye exam?  Yes  Who is the provider or what is the name of the office in which the patient attends annual eye exams? Dr. Coralee North  If pt is not established with a provider, would they like to be referred to a provider to establish care? No .   Dental Screening: Recommended annual dental exams for proper oral hygiene.  Community Resource Referral / Chronic Care Management: CRR required this visit?  No   CCM required this visit?  No     Plan:     I have personally reviewed and  noted the following in the patient's chart:   Medical and social history Use of alcohol, tobacco or illicit drugs  Current medications and supplements including opioid prescriptions. Patient is not currently taking opioid prescriptions. Functional ability and status Nutritional status Physical activity Advanced directives List of other physicians Hospitalizations, surgeries, and ER visits in previous 12 months Vitals Screenings to include cognitive, depression, and falls Referrals and appointments  In addition, I have reviewed and discussed with patient certain preventive protocols, quality metrics, and best practice recommendations. A written personalized care plan for preventive services as well as general preventive health recommendations were provided to patient.     Mickeal Needy, LPN   9/60/4540   After Visit Summary: (Declined) Due to this being a telephonic visit, with patients personalized plan was offered to patient but patient Declined AVS at this time   Nurse Notes: Patient is due for the following vaccines: Covid, Pneumonia and Shingrix.

## 2023-12-04 ENCOUNTER — Other Ambulatory Visit: Payer: Self-pay | Admitting: Internal Medicine

## 2023-12-04 DIAGNOSIS — L309 Dermatitis, unspecified: Secondary | ICD-10-CM

## 2023-12-05 NOTE — Telephone Encounter (Signed)
 Requested medication (s) are due for refill today: Yes  Requested medication (s) are on the active medication list: Yes  Last refill:  10/12/23  Future visit scheduled: No  Notes to clinic:  Unable to refill per protocol, cannot delegate.      Requested Prescriptions  Pending Prescriptions Disp Refills   triamcinolone cream (KENALOG) 0.1 % [Pharmacy Med Name: Triamcinolone Acetonide 0.1 % External Cream] 45 g 0    Sig: APPLY CREAM EXTERNALLY TWICE DAILY     Not Delegated - Dermatology:  Corticosteroids Failed - 12/05/2023 12:44 PM      Failed - This refill cannot be delegated      Passed - Valid encounter within last 12 months    Recent Outpatient Visits           5 months ago Stage 3a chronic kidney disease (HCC)   [facility] [place] Marcine Matar, MD   12 months ago Dermatitis   [facility] [place]. [facility] Marcine Matar, MD   1 year ago Stage 3a chronic kidney disease [facility])   [facility] [place]. [facility] Marcine Matar, MD   1 year ago Encounter for [employer] annual wellness exam   [facility] Of [place]. [facility] Marcine Matar, MD   3 years ago Acute diarrhea   [facility] [place] [facility] Of [place]. [facility] Marcine Matar, MD

## 2023-12-24 ENCOUNTER — Other Ambulatory Visit: Payer: Self-pay | Admitting: Internal Medicine

## 2023-12-24 DIAGNOSIS — L309 Dermatitis, unspecified: Secondary | ICD-10-CM

## 2023-12-27 DIAGNOSIS — K5190 Ulcerative colitis, unspecified, without complications: Secondary | ICD-10-CM | POA: Diagnosis not present

## 2023-12-27 DIAGNOSIS — K5090 Crohn's disease, unspecified, without complications: Secondary | ICD-10-CM | POA: Diagnosis not present

## 2024-01-09 ENCOUNTER — Other Ambulatory Visit: Payer: Self-pay | Admitting: Internal Medicine

## 2024-01-09 DIAGNOSIS — L309 Dermatitis, unspecified: Secondary | ICD-10-CM

## 2024-02-19 DIAGNOSIS — K5190 Ulcerative colitis, unspecified, without complications: Secondary | ICD-10-CM | POA: Diagnosis not present

## 2024-02-26 DIAGNOSIS — K5090 Crohn's disease, unspecified, without complications: Secondary | ICD-10-CM | POA: Diagnosis not present

## 2024-02-26 DIAGNOSIS — K5190 Ulcerative colitis, unspecified, without complications: Secondary | ICD-10-CM | POA: Diagnosis not present

## 2024-02-27 DIAGNOSIS — K5190 Ulcerative colitis, unspecified, without complications: Secondary | ICD-10-CM | POA: Diagnosis not present

## 2024-03-31 ENCOUNTER — Other Ambulatory Visit: Payer: Self-pay | Admitting: Internal Medicine

## 2024-03-31 DIAGNOSIS — L309 Dermatitis, unspecified: Secondary | ICD-10-CM

## 2024-04-28 DIAGNOSIS — K5190 Ulcerative colitis, unspecified, without complications: Secondary | ICD-10-CM | POA: Diagnosis not present

## 2024-04-30 DIAGNOSIS — K5190 Ulcerative colitis, unspecified, without complications: Secondary | ICD-10-CM | POA: Diagnosis not present

## 2024-04-30 DIAGNOSIS — K5090 Crohn's disease, unspecified, without complications: Secondary | ICD-10-CM | POA: Diagnosis not present

## 2024-05-05 DIAGNOSIS — K5190 Ulcerative colitis, unspecified, without complications: Secondary | ICD-10-CM | POA: Diagnosis not present

## 2024-05-22 DIAGNOSIS — K529 Noninfective gastroenteritis and colitis, unspecified: Secondary | ICD-10-CM | POA: Diagnosis not present

## 2024-05-22 DIAGNOSIS — Z9884 Bariatric surgery status: Secondary | ICD-10-CM | POA: Diagnosis not present

## 2024-05-22 DIAGNOSIS — Z860102 Personal history of hyperplastic colon polyps: Secondary | ICD-10-CM | POA: Diagnosis not present

## 2024-05-22 DIAGNOSIS — Z1211 Encounter for screening for malignant neoplasm of colon: Secondary | ICD-10-CM | POA: Diagnosis not present

## 2024-05-22 DIAGNOSIS — Z83719 Family history of colon polyps, unspecified: Secondary | ICD-10-CM | POA: Diagnosis not present

## 2024-05-22 DIAGNOSIS — K5150 Left sided colitis without complications: Secondary | ICD-10-CM | POA: Diagnosis not present

## 2024-05-22 DIAGNOSIS — R197 Diarrhea, unspecified: Secondary | ICD-10-CM | POA: Diagnosis not present

## 2024-05-22 DIAGNOSIS — K5190 Ulcerative colitis, unspecified, without complications: Secondary | ICD-10-CM | POA: Diagnosis not present

## 2024-05-22 DIAGNOSIS — Z85828 Personal history of other malignant neoplasm of skin: Secondary | ICD-10-CM | POA: Diagnosis not present

## 2024-06-23 DIAGNOSIS — K5190 Ulcerative colitis, unspecified, without complications: Secondary | ICD-10-CM | POA: Diagnosis not present

## 2024-06-25 DIAGNOSIS — K5190 Ulcerative colitis, unspecified, without complications: Secondary | ICD-10-CM | POA: Diagnosis not present

## 2024-06-25 DIAGNOSIS — K5090 Crohn's disease, unspecified, without complications: Secondary | ICD-10-CM | POA: Diagnosis not present

## 2024-08-18 DIAGNOSIS — K5190 Ulcerative colitis, unspecified, without complications: Secondary | ICD-10-CM | POA: Diagnosis not present

## 2024-08-22 DIAGNOSIS — K5090 Crohn's disease, unspecified, without complications: Secondary | ICD-10-CM | POA: Diagnosis not present

## 2024-11-25 ENCOUNTER — Ambulatory Visit: Payer: Medicare HMO
